# Patient Record
Sex: Male | Born: 1975 | Race: Black or African American | Hispanic: No | Marital: Married | Smoking: Current some day smoker
Health system: Southern US, Community
[De-identification: ages and names within clinical notes are randomized; demographics above are authoritative.]

## PROBLEM LIST (undated history)

## (undated) DIAGNOSIS — K219 Gastro-esophageal reflux disease without esophagitis: Secondary | ICD-10-CM

## (undated) DIAGNOSIS — T4145XA Adverse effect of unspecified anesthetic, initial encounter: Secondary | ICD-10-CM

## (undated) DIAGNOSIS — S0291XA Unspecified fracture of skull, initial encounter for closed fracture: Secondary | ICD-10-CM

## (undated) DIAGNOSIS — R51 Headache: Secondary | ICD-10-CM

## (undated) DIAGNOSIS — M1990 Unspecified osteoarthritis, unspecified site: Secondary | ICD-10-CM

## (undated) DIAGNOSIS — T8859XA Other complications of anesthesia, initial encounter: Secondary | ICD-10-CM

## (undated) DIAGNOSIS — R569 Unspecified convulsions: Secondary | ICD-10-CM

## (undated) DIAGNOSIS — R519 Headache, unspecified: Secondary | ICD-10-CM

## (undated) DIAGNOSIS — S0990XA Unspecified injury of head, initial encounter: Secondary | ICD-10-CM

## (undated) DIAGNOSIS — I2699 Other pulmonary embolism without acute cor pulmonale: Secondary | ICD-10-CM

## (undated) HISTORY — PX: EYE SURGERY: SHX253

## (undated) HISTORY — PX: CATARACT EXTRACTION: SUR2

## (undated) HISTORY — DX: Unspecified convulsions: R56.9

## (undated) HISTORY — PX: SHOULDER SURGERY: SHX246

## (undated) HISTORY — PX: NECK SURGERY: SHX720

## (undated) HISTORY — PX: KNEE ASPIRATION: SHX1892

## (undated) HISTORY — PX: CRANIECTOMY: SHX331

## (undated) HISTORY — PX: STOMACH SURGERY: SHX791

## (undated) HISTORY — PX: BREAST EXCISIONAL BIOPSY: SUR124

## (undated) HISTORY — PX: BACK SURGERY: SHX140

## (undated) HISTORY — PX: KNEE SURGERY: SHX244

---

## 1997-09-09 ENCOUNTER — Emergency Department (HOSPITAL_COMMUNITY): Admission: EM | Admit: 1997-09-09 | Discharge: 1997-09-09 | Payer: Self-pay | Admitting: Emergency Medicine

## 2001-06-17 ENCOUNTER — Encounter: Payer: Self-pay | Admitting: Emergency Medicine

## 2001-06-17 ENCOUNTER — Inpatient Hospital Stay (HOSPITAL_COMMUNITY): Admission: AC | Admit: 2001-06-17 | Discharge: 2001-06-29 | Payer: Self-pay

## 2001-06-18 ENCOUNTER — Encounter: Payer: Self-pay | Admitting: General Surgery

## 2001-06-19 ENCOUNTER — Encounter: Payer: Self-pay | Admitting: General Surgery

## 2001-06-20 ENCOUNTER — Encounter: Payer: Self-pay | Admitting: General Surgery

## 2001-06-21 ENCOUNTER — Encounter: Payer: Self-pay | Admitting: General Surgery

## 2001-06-21 ENCOUNTER — Encounter: Payer: Self-pay | Admitting: Neurosurgery

## 2001-06-22 ENCOUNTER — Encounter: Payer: Self-pay | Admitting: General Surgery

## 2001-06-23 ENCOUNTER — Encounter: Payer: Self-pay | Admitting: General Surgery

## 2001-06-24 ENCOUNTER — Encounter: Payer: Self-pay | Admitting: General Surgery

## 2001-06-24 ENCOUNTER — Encounter: Payer: Self-pay | Admitting: Neurosurgery

## 2001-06-25 ENCOUNTER — Encounter: Payer: Self-pay | Admitting: General Surgery

## 2001-06-26 ENCOUNTER — Encounter: Payer: Self-pay | Admitting: General Surgery

## 2001-06-27 ENCOUNTER — Encounter: Payer: Self-pay | Admitting: General Surgery

## 2001-06-28 ENCOUNTER — Encounter: Payer: Self-pay | Admitting: General Surgery

## 2001-06-29 ENCOUNTER — Inpatient Hospital Stay (HOSPITAL_COMMUNITY)
Admission: AD | Admit: 2001-06-29 | Discharge: 2001-07-09 | Payer: Self-pay | Admitting: Physical Medicine & Rehabilitation

## 2001-06-29 ENCOUNTER — Encounter: Payer: Self-pay | Admitting: General Surgery

## 2001-06-30 ENCOUNTER — Encounter: Payer: Self-pay | Admitting: Physical Medicine & Rehabilitation

## 2001-07-03 ENCOUNTER — Encounter: Payer: Self-pay | Admitting: Physical Medicine & Rehabilitation

## 2001-07-05 ENCOUNTER — Encounter: Payer: Self-pay | Admitting: Physical Medicine & Rehabilitation

## 2001-07-06 ENCOUNTER — Encounter: Payer: Self-pay | Admitting: Neurosurgery

## 2001-07-09 ENCOUNTER — Inpatient Hospital Stay (HOSPITAL_COMMUNITY): Admission: AD | Admit: 2001-07-09 | Discharge: 2001-07-12 | Payer: Self-pay | Admitting: Neurosurgery

## 2001-07-22 ENCOUNTER — Encounter
Admission: RE | Admit: 2001-07-22 | Discharge: 2001-10-20 | Payer: Self-pay | Admitting: Physical Medicine & Rehabilitation

## 2001-08-10 ENCOUNTER — Encounter: Payer: Self-pay | Admitting: Ophthalmology

## 2001-08-10 ENCOUNTER — Ambulatory Visit (HOSPITAL_COMMUNITY): Admission: RE | Admit: 2001-08-10 | Discharge: 2001-08-11 | Payer: Self-pay | Admitting: Ophthalmology

## 2001-08-30 ENCOUNTER — Encounter: Payer: Self-pay | Admitting: Physical Medicine & Rehabilitation

## 2001-08-30 ENCOUNTER — Ambulatory Visit (HOSPITAL_COMMUNITY)
Admission: RE | Admit: 2001-08-30 | Discharge: 2001-08-30 | Payer: Self-pay | Admitting: Physical Medicine & Rehabilitation

## 2001-10-21 ENCOUNTER — Encounter
Admission: RE | Admit: 2001-10-21 | Discharge: 2002-01-19 | Payer: Self-pay | Admitting: Physical Medicine & Rehabilitation

## 2002-09-17 ENCOUNTER — Encounter: Payer: Self-pay | Admitting: Orthopedic Surgery

## 2002-09-17 ENCOUNTER — Ambulatory Visit (HOSPITAL_COMMUNITY): Admission: RE | Admit: 2002-09-17 | Discharge: 2002-09-17 | Payer: Self-pay | Admitting: Orthopedic Surgery

## 2003-04-22 ENCOUNTER — Emergency Department (HOSPITAL_COMMUNITY): Admission: EM | Admit: 2003-04-22 | Discharge: 2003-04-22 | Payer: Self-pay | Admitting: Emergency Medicine

## 2005-03-05 ENCOUNTER — Encounter: Admission: RE | Admit: 2005-03-05 | Discharge: 2005-03-05 | Payer: Self-pay | Admitting: Emergency Medicine

## 2005-04-14 ENCOUNTER — Emergency Department (HOSPITAL_COMMUNITY): Admission: EM | Admit: 2005-04-14 | Discharge: 2005-04-14 | Payer: Self-pay | Admitting: Emergency Medicine

## 2007-06-18 ENCOUNTER — Emergency Department (HOSPITAL_COMMUNITY): Admission: EM | Admit: 2007-06-18 | Discharge: 2007-06-18 | Payer: Self-pay | Admitting: Emergency Medicine

## 2009-01-27 ENCOUNTER — Emergency Department (HOSPITAL_COMMUNITY): Admission: EM | Admit: 2009-01-27 | Discharge: 2009-01-27 | Payer: Self-pay | Admitting: Emergency Medicine

## 2010-05-28 LAB — BASIC METABOLIC PANEL
BUN: 13 mg/dL (ref 6–23)
CO2: 28 mEq/L (ref 19–32)
Calcium: 9.3 mg/dL (ref 8.4–10.5)
Chloride: 108 mEq/L (ref 96–112)
Creatinine, Ser: 1 mg/dL (ref 0.4–1.5)
GFR calc Af Amer: >60 mL/min (ref >60)

## 2010-05-28 LAB — CBC
MCHC: 34.8 g/dL (ref 30.0–36.0)
MCV: 93.1 fL (ref 78.0–100.0)
Platelets: 186 10*3/uL (ref 150–400)
RBC: 4.66 MIL/uL (ref 4.22–5.81)

## 2010-05-28 LAB — D-DIMER, QUANTITATIVE: D-Dimer, Quant: 0.28 ug/mL-FEU (ref 0.00–0.48)

## 2010-05-28 LAB — PROTIME-INR: Prothrombin Time: 13.1 seconds (ref 11.6–15.2)

## 2010-05-28 LAB — POCT CARDIAC MARKERS: Myoglobin, poc: 125 ng/mL (ref 12–200)
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
## 2011-06-27 ENCOUNTER — Emergency Department (HOSPITAL_COMMUNITY)
Admission: EM | Admit: 2011-06-27 | Discharge: 2011-06-27 | Disposition: A | Discharge status: Home / Self Care | Payer: Federal, State, Local not specified - PPO | Attending: Emergency Medicine | Admitting: Emergency Medicine

## 2011-06-27 ENCOUNTER — Encounter (HOSPITAL_COMMUNITY): Payer: Self-pay | Admitting: *Deleted

## 2011-06-27 ENCOUNTER — Emergency Department (HOSPITAL_COMMUNITY): Payer: Federal, State, Local not specified - PPO

## 2011-06-27 DIAGNOSIS — R51 Headache: Secondary | ICD-10-CM | POA: Insufficient documentation

## 2011-06-27 DIAGNOSIS — R29898 Other symptoms and signs involving the musculoskeletal system: Secondary | ICD-10-CM | POA: Insufficient documentation

## 2011-06-27 DIAGNOSIS — M25519 Pain in unspecified shoulder: Secondary | ICD-10-CM | POA: Insufficient documentation

## 2011-06-27 DIAGNOSIS — F17200 Nicotine dependence, unspecified, uncomplicated: Secondary | ICD-10-CM | POA: Insufficient documentation

## 2011-06-27 DIAGNOSIS — R209 Unspecified disturbances of skin sensation: Secondary | ICD-10-CM | POA: Insufficient documentation

## 2011-06-27 DIAGNOSIS — Z87820 Personal history of traumatic brain injury: Secondary | ICD-10-CM | POA: Insufficient documentation

## 2011-06-27 DIAGNOSIS — Z79899 Other long term (current) drug therapy: Secondary | ICD-10-CM | POA: Insufficient documentation

## 2011-06-27 MED ORDER — METOCLOPRAMIDE HCL 10 MG PO TABS: 10.0000 mg | ORAL_TABLET | Freq: Four times a day (QID) | ORAL | Status: DC | PRN

## 2011-06-27 MED ORDER — HYDROCODONE-ACETAMINOPHEN 5-325 MG PO TABS: 2.0000 | ORAL_TABLET | ORAL | Status: AC | PRN

## 2011-06-27 NOTE — Discharge Instructions (Signed)

## 2011-06-27 NOTE — ED Notes (Signed)
Pt returned from CT scan.

## 2011-06-27 NOTE — ED Notes (Signed)
Pt reports blurry vision
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
## 2011-06-27 NOTE — ED Provider Notes (Signed)
History     CSN: [id number]  Arrival date & time 06/27/11  0144   First MD Initiated Contact with Patient 06/27/11 0208      Chief Complaint  Patient presents with  . 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 He now presents with a gradual onset 2-3 day history of a pressure-like headache without any sudden onset and without any significant change in speech vision swallowing or understanding. He also has no vertigo. There is no recent trauma. There is no fever. He has no neck pain or back pain. He has had one to 2 weeks of left shoulder pain with associated weakness and numbness on the ulnar nerve distribution region of his left arm which is improving with steroids from his usual family doctor.  Since he does not usually get headaches and has a history of a traumatic brain injury he wants to get a CT scan of the head tonight if possible. He has not had any new weakness or numbness to his left arm since his before headache, he also has no weakness or numbness to his left leg. There's been no treatment prior to arrival of his headache he did drive himself to the emergency department and does not want medicines while he is in the ED for his headache. History reviewed. No pertinent past medical history. Prior traumatic brain injury with some short-term memory problems and poor vision as well as apparent posttraumatic seizures in the past History reviewed. No pertinent past surgical history.  No family history on file.  History  Substance Use Topics  . Smoking status: Current Everyday Smoker  . Smokeless tobacco: Not on file  . Alcohol Use: No      Review of Systems  Constitutional: Negative for fever.       10 Systems reviewed and are  negative for acute change except as noted in the HPI.  HENT: Negative for congestion and neck pain.   Eyes: Negative for pain, discharge and redness.  Respiratory: Negative for cough and shortness of breath.   Cardiovascular: Negative for chest pain.  Gastrointestinal: Negative for vomiting and abdominal pain.  Musculoskeletal: Negative for back pain.  Skin: Negative for rash.  Neurological: Positive for headaches. Negative for seizures, syncope, speech difficulty, weakness, light-headedness and numbness.  Psychiatric/Behavioral:       No behavior change.    Allergies  Review of patient's allergies indicates no known allergies.  Home Medications   Current Outpatient Rx  Name Route Sig Dispense Refill  . HYDROCODONE-ACETAMINOPHEN 5-325 MG PO TABS Oral Take 2 tablets by mouth every 4 (four) hours as needed for pain. 10 tablet 0  . METOCLOPRAMIDE HCL 10 MG PO TABS Oral Take 1 tablet (10 mg total) by mouth every 6 (six) hours as needed (nausea/headache). 6 tablet 0    BP 103/67  Pulse 80  Temp(Src) 97.4 F (36.3 C) (Oral)  Resp 18  SpO2 96%  Physical Exam  Nursing note and vitals reviewed. Constitutional:       Awake, alert, nontoxic appearance with baseline speech for patient.  HENT:  Head: Atraumatic.  Mouth/Throat: No oropharyngeal exudate.  Eyes: EOM are normal. Pupils are equal, round, and reactive to light. Right eye exhibits no discharge. Left eye exhibits no discharge.  Neck: Neck supple.  Cardiovascular: Normal rate and regular  rhythm.   No murmur heard. Pulmonary/Chest: Effort normal and breath sounds normal. No stridor. No respiratory distress. He has no wheezes. He has no rales. He exhibits no tenderness.  Abdominal: Soft. Bowel sounds are normal. He exhibits no mass. There is no tenderness. There is no rebound.  Musculoskeletal: He exhibits no tenderness.       Baseline ROM, moves extremities with no obvious new focal weakness.  Lymphadenopathy:    He has no  cervical adenopathy.  Neurological: He is alert.       Awake, alert, cooperative and aware of situation; motor strength bilaterally 5/5; sensation normal to light touch bilaterally except slight decreased light touch to his left small finger and ring finger ; peripheral visual fields full to confrontation; no facial asymmetry; tongue midline; major cranial nerves appear intact; no pronator drift, normal finger to nose bilaterally  Skin: No rash noted.  Psychiatric: He has a normal mood and affect.    ED Course  Procedures (including critical care time)  Labs Reviewed - No data to display Ct Head Wo Contrast  06/27/2011  *RADIOLOGY REPORT*  Clinical Data: Seizure, headache.  CT HEAD WITHOUT CONTRAST  Technique:  Contiguous axial images were obtained from the base of the skull through the vertex without contrast.  Comparison: 06/18/2007  Findings: Left temporal lobe encephalomalacia inferiorly.  Inferior left frontal lobe encephalomalacia.  Evidence of prior left craniotomy. There is no evidence for acute hemorrhage, hydrocephalus, mass lesion, or abnormal extra-axial fluid collection.  No definite CT evidence for acute infarction.  The visualized paranasal sinuses and mastoid air cells are predominately clear.  Right maxillary sinus and left maxillary sinus each contain a mucous retention cyst.  IMPRESSION: Left frontal and temporal lobe encephalomalacia.  No definite acute intracranial abnormality.  Original Report Authenticated By: Waneta Martins, M.D.     1. Headache       MDM  Pt stable in ED with no significant deterioration in condition.Patient / Family / Caregiver informed of clinical course, understand medical decision-making process, and agree with plan.I doubt any other EMC precluding discharge at this time including, but not necessarily limited to the following:SAH, CVA, SBI.        Hurman Horn, MD 06/28/11 0630

## 2011-06-27 NOTE — ED Notes (Signed)
The pt has had head pain and pressure for 2 days

## 2011-06-27 NOTE — ED Notes (Signed)
Rx x 2. Pt voiced understanding to f/u with PCP in 3-5 days.

## 2011-07-02 ENCOUNTER — Ambulatory Visit: Payer: Self-pay

## 2012-02-09 ENCOUNTER — Emergency Department (INDEPENDENT_AMBULATORY_CARE_PROVIDER_SITE_OTHER): Payer: Medicare Other

## 2012-02-09 ENCOUNTER — Other Ambulatory Visit: Payer: Self-pay

## 2012-02-09 ENCOUNTER — Emergency Department (INDEPENDENT_AMBULATORY_CARE_PROVIDER_SITE_OTHER)
Admission: EM | Admit: 2012-02-09 | Discharge: 2012-02-09 | Disposition: A | Discharge status: Home / Self Care | Payer: Medicare Other | Source: Home / Self Care

## 2012-02-09 ENCOUNTER — Encounter (HOSPITAL_COMMUNITY): Payer: Self-pay | Admitting: Emergency Medicine

## 2012-02-09 DIAGNOSIS — R064 Hyperventilation: Secondary | ICD-10-CM

## 2012-02-09 DIAGNOSIS — R0789 Other chest pain: Secondary | ICD-10-CM

## 2012-02-09 DIAGNOSIS — R071 Chest pain on breathing: Secondary | ICD-10-CM

## 2012-02-09 LAB — D-DIMER, QUANTITATIVE: D-Dimer, Quant: <0.27 ug/mL-FEU (ref 0.00–0.48)

## 2012-02-09 MED ORDER — KETOROLAC TROMETHAMINE 60 MG/2ML IM SOLN
INTRAMUSCULAR | Status: AC
  Filled 2012-02-09: qty 2

## 2012-02-09 MED ORDER — NAPROXEN 500 MG PO TBEC: 500.0000 mg | DELAYED_RELEASE_TABLET | Freq: Two times a day (BID) | ORAL | Status: DC

## 2012-02-09 MED ORDER — LORAZEPAM 2 MG/ML IJ SOLN
1.0000 mg | Freq: Once | INTRAMUSCULAR | Status: AC
  Administered 2012-02-09: 1 mg via INTRAMUSCULAR

## 2012-02-09 MED ORDER — KETOROLAC TROMETHAMINE 60 MG/2ML IM SOLN
60.0000 mg | Freq: Once | INTRAMUSCULAR | Status: AC
  Administered 2012-02-09: 60 mg via INTRAMUSCULAR

## 2012-02-09 MED ORDER — ACETAMINOPHEN 325 MG PO TABS
ORAL_TABLET | ORAL | Status: AC
  Filled 2012-02-09: qty 2

## 2012-02-09 MED ORDER — TRAMADOL HCL 50 MG PO TABS: 50.0000 mg | ORAL_TABLET | Freq: Four times a day (QID) | ORAL | Status: DC | PRN

## 2012-02-09 MED ORDER — ACETAMINOPHEN 325 MG PO TABS
650.0000 mg | ORAL_TABLET | Freq: Once | ORAL | Status: AC
  Administered 2012-02-09: 650 mg via ORAL

## 2012-02-09 MED ORDER — LORAZEPAM 2 MG/ML IJ SOLN
INTRAMUSCULAR | Status: AC
  Filled 2012-02-09: qty 1

## 2012-02-09 NOTE — ED Provider Notes (Addendum)
History     CSN: [id number]  Arrival date & time 02/09/12  1746   None     Chief Complaint  Patient presents with  . Chest Pain    (Consider location/radiation/quality/duration/timing/severity/associated sxs/prior treatment) HPI Comments: 36 year old male states that he is having left anterior chest pain. He describes it as a drawing and sharp pain. Has pain when taking a deep breath.It is pleuritic.  He is breathing approximately 40 times a minute with short choppy breaths. Is also complaining of  paresthesias especially in his left arm. States his left arm is weak he can barely raise it above his head. He felt well last night until after eating.  He then started feeling cold all night long, states his temperature was 100.2 degrees and later 101.3. This was associated with shaking chills. [facility] earlier today and his flu test  was negative. Because he had a systolic blood pressure of 100 at the [facility] he was sent to the urgent care. Is expending a great amount of energy to breath and wincing of discomfort in his face and grabbing his left chest.  Patient is a 36 y.o. male presenting with chest pain.  Chest Pain Primary symptoms include a fever.     History reviewed. No pertinent past medical history.  Past Surgical History  Procedure Date  . Eye surgery   . Neck surgery   . Stomach surgery     No family history on file.  History  Substance Use Topics  . Smoking status: Current Every Day Smoker  . Smokeless tobacco: Not on file  . Alcohol Use: No      Review of Systems  Constitutional: Positive for fever and chills.  HENT: Negative for congestion, sore throat, facial swelling, rhinorrhea, neck stiffness, voice change and postnasal drip.   Cardiovascular: Positive for chest pain.  Gastrointestinal: Negative.   Musculoskeletal: Negative for back pain.    Allergies  Review of patient's allergies indicates no known allergies.  Home  Medications   Current Outpatient Rx  Name  Route  Sig  Dispense  Refill  . METOCLOPRAMIDE HCL 10 MG PO TABS   Oral   Take 1 tablet (10 mg total) by mouth every 6 (six) hours as needed (nausea/headache).   6 tablet   0   . NAPROXEN 500 MG PO TBEC   Oral   Take 1 tablet (500 mg total) by mouth 2 (two) times daily with a meal.   20 tablet   0   . TRAMADOL HCL 50 MG PO TABS   Oral   Take 1 tablet (50 mg total) by mouth every 6 (six) hours as needed for pain. Take one tablet every 4 hours as needed for pain   25 tablet   0     BP 113/69  Pulse 114  Temp 98.2 F (36.8 C) (Oral)  Resp 22  SpO2 100%  Physical Exam  Constitutional: He is oriented to person, place, and time. He appears well-developed and well-nourished. He appears distressed.  Neck: Normal range of motion. Neck supple.  Cardiovascular: Normal rate, regular rhythm and normal heart sounds.   Pulmonary/Chest: He has no wheezes. He has no rales.       Patient is hyperventilating clearly putting a lot of energy  into his respirations. He is taking rapid shallow breaths. From what I can auscultate his lungs are clear.    Musculoskeletal: He exhibits tenderness.       He has some  left anterior chest wall tenderness however most of the pain comes from taking a deep breath. Otherwise no muscular skeletal pain or tenderness.  Neurological: He is alert and oriented to person, place, and time.  Skin: Skin is warm and dry. No erythema.    ED Course  Procedures (including critical care time)   Labs Reviewed  D-DIMER, QUANTITATIVE   Dg Chest 2 View  02/09/2012  *RADIOLOGY REPORT*  Clinical Data: Chest pain, tachypnea  CHEST - 2 VIEW  Comparison: 01/27/2009  Findings: Lungs are clear. No pleural effusion or pneumothorax.  Cardiomediastinal silhouette is within normal limits.  Visualized osseous structures are within normal limits.  IMPRESSION: No evidence of acute cardiopulmonary disease.   Original Report Authenticated  By: Charline Bills, M.D.      1. Chest wall pain   2. Hyperventilation       MDM   Results for orders placed during the hospital encounter of 02/09/12  D-DIMER, QUANTITATIVE      Component Value Range   D-Dimer, Quant <0.27  0.00 - 0.48 ug/mL-FEU   No information on file. Dg Chest 2 View  02/09/2012  *RADIOLOGY REPORT*  Clinical Data: Chest pain, tachypnea  CHEST - 2 VIEW  Comparison: 01/27/2009  Findings: Lungs are clear. No pleural effusion or pneumothorax.  Cardiomediastinal silhouette is within normal limits.  Visualized osseous structures are within normal limits.  IMPRESSION: No evidence of acute cardiopulmonary disease.   Original Report Authenticated By: Charline Bills, M.D.   EKG: Normal sinus rhythm no ectopy. No ischemic changes, reviewed by Dr. Delene Ruffini The patient was placed on a rebreathing bag to assist with hyperventilation. A d-dimer and chest x-ray was ordered. Results are above. Ativan 1 mg IM administered for anxiety and hyperventilation. Once the d-dimer was back he was administered Toradol 60 mg IM. After this treatment he was lying down and relaxed posturing position with no hyperventilation. He states he was feeling better and ready to go. He does have a low-grade fever I suspect he may also have a viral type illness as well. He is instructed to take Tylenol every 4 hours as needed for fever and if he develops shortness of breath, cough, fever or other symptoms or worsening he may return or go to the emergency department or follow with his physician.         Hayden Rasmussen, NP 02/09/12 2003  Hayden Rasmussen, NP 02/13/12 2114

## 2012-02-09 NOTE — ED Notes (Addendum)
Pt c/o chest pain since yesterday night... Sx include: fever this am 102, shivering, blurry vision, headaches, numbness of left arm, diarrhea.... Denies: vomiting, nauseas... No history of HTN... Family hx of CVD... Pt is alert w/pain discomfort... Took Theraflu last night... [facility] and was told [facility] BP was low (100/58)

## 2012-02-09 NOTE — ED Notes (Signed)
Pt texting on phone while being triaged.

## 2012-02-13 NOTE — ED Provider Notes (Signed)
Medical screening examination/treatment/procedure(s) were performed by resident physician or non-physician practitioner and as supervising physician I was immediately available for consultation/collaboration.   KINDL,JAMES DOUGLAS MD.    James D Kindl, MD 02/13/12 1123 

## 2012-02-14 NOTE — ED Provider Notes (Signed)
Medical screening examination/treatment/procedure(s) were performed by resident physician or non-physician practitioner and as supervising physician I was immediately available for consultation/collaboration.   KINDL,JAMES DOUGLAS MD.    James D Kindl, MD 02/14/12 1303 

## 2012-12-08 ENCOUNTER — Telehealth: Payer: Self-pay | Admitting: Neurology

## 2012-12-08 NOTE — Telephone Encounter (Signed)
Chart reviewed, he has traumatic brain injury, seizure disorder.  He was taking Depakote, but on longer on it, last visit was in May 2013. I called him, he complains of dizziness.  Please call him, give him a follow up with NP in next available.

## 2013-01-12 ENCOUNTER — Encounter (HOSPITAL_COMMUNITY): Payer: Self-pay | Admitting: Emergency Medicine

## 2013-01-12 ENCOUNTER — Emergency Department (HOSPITAL_COMMUNITY)
Admission: EM | Admit: 2013-01-12 | Discharge: 2013-01-12 | Disposition: A | Discharge status: Home / Self Care | Payer: Federal, State, Local not specified - PPO | Attending: Emergency Medicine | Admitting: Emergency Medicine

## 2013-01-12 DIAGNOSIS — Z79899 Other long term (current) drug therapy: Secondary | ICD-10-CM | POA: Insufficient documentation

## 2013-01-12 DIAGNOSIS — R569 Unspecified convulsions: Secondary | ICD-10-CM

## 2013-01-12 DIAGNOSIS — Z8781 Personal history of (healed) traumatic fracture: Secondary | ICD-10-CM | POA: Insufficient documentation

## 2013-01-12 DIAGNOSIS — G40909 Epilepsy, unspecified, not intractable, without status epilepticus: Secondary | ICD-10-CM | POA: Insufficient documentation

## 2013-01-12 DIAGNOSIS — F17200 Nicotine dependence, unspecified, uncomplicated: Secondary | ICD-10-CM | POA: Insufficient documentation

## 2013-01-12 DIAGNOSIS — Z87820 Personal history of traumatic brain injury: Secondary | ICD-10-CM | POA: Insufficient documentation

## 2013-01-12 HISTORY — DX: Unspecified injury of head, initial encounter: S09.90XA

## 2013-01-12 HISTORY — DX: Unspecified fracture of skull, initial encounter for closed fracture: S02.91XA

## 2013-01-12 LAB — CBC WITH DIFFERENTIAL/PLATELET
Eosinophils Absolute: 0.3 10*3/uL (ref 0.0–0.7)
Eosinophils Relative: 6 % — ABNORMAL HIGH (ref 0–5)
HCT: 43.2 % (ref 39.0–52.0)
Hemoglobin: 14.6 g/dL (ref 13.0–17.0)
Lymphocytes Relative: 47 % — ABNORMAL HIGH (ref 12–46)
Lymphs Abs: 2.6 10*3/uL (ref 0.7–4.0)
MCH: 30.5 pg (ref 26.0–34.0)
MCV: 90.2 fL (ref 78.0–100.0)
Monocytes Absolute: 0.4 10*3/uL (ref 0.1–1.0)
Monocytes Relative: 7 % (ref 3–12)
Platelets: 243 10*3/uL (ref 150–400)
RBC: 4.79 MIL/uL (ref 4.22–5.81)
WBC: 5.4 10*3/uL (ref 4.0–10.5)

## 2013-01-12 LAB — BASIC METABOLIC PANEL
CO2: 25 mEq/L (ref 19–32)
Calcium: 9.6 mg/dL (ref 8.4–10.5)
Chloride: 106 mEq/L (ref 96–112)
Glucose, Bld: 94 mg/dL (ref 70–99)
Potassium: 3.6 mEq/L (ref 3.5–5.1)
Sodium: 141 mEq/L (ref 135–145)

## 2013-01-12 LAB — VALPROIC ACID LEVEL: Valproic Acid Lvl: <10 ug/mL — ABNORMAL LOW (ref 50.0–100.0)

## 2013-01-12 MED ORDER — DIVALPROEX SODIUM 250 MG PO DR TAB
500.0000 mg | DELAYED_RELEASE_TABLET | Freq: Two times a day (BID) | ORAL | Status: DC
  Administered 2013-01-12: 500 mg via ORAL
  Filled 2013-01-12: qty 2

## 2013-01-12 NOTE — ED Notes (Signed)
Patient with no complaints at this time. Respirations even and unlabored. Skin warm/dry. Discharge instructions reviewed with patient at this time. Patient given opportunity to voice concerns/ask questions. IV removed per policy and band-aid applied to site. Patient discharged at this time and left Emergency Department with steady gait.  

## 2013-01-12 NOTE — ED Notes (Signed)
Patient arrives via EMS from work with c/o seizure. H/o seizures post head injury requiring craniotomy 11 years ago. Reports last seizure was 5 years ago. Denies missing medication. Only c/o is of cramping to hands and feet.

## 2013-01-12 NOTE — ED Provider Notes (Signed)
CSN: [id number]     Arrival date & time 01/12/13  1101 History   First MD Initiated Contact with Patient 01/12/13 1238     Chief Complaint  Patient presents with  . Seizures   (Consider location/radiation/quality/duration/timing/severity/associated sxs/prior Treatment) HPI.... level V caveat for urgent need for intervention.   Patient apparently had a tonic-clonic seizure at work this morning.  He is on Depakote secondary to a seizure disorder which resulted after traumatic brain injury approximately 11 years ago.  He has not had a seizure in 5 years.  He is feeling better now.   No fever, chills, stiff neck  Past Medical History  Diagnosis Date  . Closed head injury     2003  . Skull fracture     2003   Past Surgical History  Procedure Laterality Date  . Eye surgery    . Neck surgery    . Stomach surgery    . Craniectomy      2003   No family history on file. History  Substance Use Topics  . Smoking status: Current Some Day Smoker    Types: Cigarettes  . Smokeless tobacco: Not on file  . Alcohol Use: No    Review of Systems  Unable to perform ROS: Acuity of condition    Allergies  Review of patient's allergies indicates no known allergies.  Home Medications   Current Outpatient Rx  Name  Route  Sig  Dispense  Refill  . divalproex (DEPAKOTE ER) 250 MG 24 hr tablet   Oral   Take 500 mg by mouth 2 (two) times daily.          BP 119/79  Pulse 80  Temp(Src) 98.7 F (37.1 C) (Oral)  Resp 15  Ht 5\' 11"  (1.803 m)  Wt 265 lb (120.203 kg)  BMI 36.98 kg/m2  SpO2 100% Physical Exam  Nursing note and vitals reviewed. Constitutional: He is oriented to person, place, and time. He appears well-developed and well-nourished.  HENT:  Head: Normocephalic and atraumatic.  Eyes: Conjunctivae and EOM are normal. Pupils are equal, round, and reactive to light.  Neck: Normal range of motion. Neck supple.  Cardiovascular: Normal rate, regular rhythm and normal heart  sounds.   Pulmonary/Chest: Effort normal and breath sounds normal.  Abdominal: Soft. Bowel sounds are normal.  nontender  Genitourinary:  Normal genitalia  Musculoskeletal: Normal range of motion.  Neurological: He is alert and oriented to person, place, and time.  Skin: Skin is warm and dry.  Pink color  Psychiatric: He has a normal mood and affect. His behavior is normal.    ED Course  Procedures (including critical care time) Labs Review Labs Reviewed  CBC WITH DIFFERENTIAL - Abnormal; Notable for the following:    Neutrophils Relative % 39 (*)    Lymphocytes Relative 47 (*)    Eosinophils Relative 6 (*)    All other components within normal limits  VALPROIC ACID LEVEL - Abnormal; Notable for the following:    Valproic Acid Lvl <10.0 (*)    All other components within normal limits  BASIC METABOLIC PANEL   Imaging Review No results found.  EKG Interpretation   None       MDM   1. Seizure     Valproic acid level noted to be subtherapeutic.   Will increase medication 50%.   Patient will followup with his neurologist. Discussed with patient and his mother. Patient is neurologically intact at discharge    Donnetta Hutching, MD 01/12/13  1608 

## 2013-03-18 ENCOUNTER — Encounter: Payer: Self-pay | Admitting: Nurse Practitioner

## 2013-03-21 ENCOUNTER — Encounter: Payer: Self-pay | Admitting: Neurology

## 2013-03-21 ENCOUNTER — Encounter: Payer: Self-pay | Admitting: Nurse Practitioner

## 2013-03-21 ENCOUNTER — Other Ambulatory Visit: Payer: Self-pay | Admitting: Nurse Practitioner

## 2013-03-21 ENCOUNTER — Telehealth: Payer: Self-pay | Admitting: Nurse Practitioner

## 2013-03-21 ENCOUNTER — Encounter (INDEPENDENT_AMBULATORY_CARE_PROVIDER_SITE_OTHER): Payer: Self-pay

## 2013-03-21 ENCOUNTER — Ambulatory Visit (INDEPENDENT_AMBULATORY_CARE_PROVIDER_SITE_OTHER): Payer: Federal, State, Local not specified - PPO | Admitting: Nurse Practitioner

## 2013-03-21 VITALS — BP 114/75 | HR 96 | Ht 71.0 in | Wt 270.0 lb

## 2013-03-21 DIAGNOSIS — G40209 Localization-related (focal) (partial) symptomatic epilepsy and epileptic syndromes with complex partial seizures, not intractable, without status epilepticus: Secondary | ICD-10-CM

## 2013-03-21 DIAGNOSIS — Z79899 Other long term (current) drug therapy: Secondary | ICD-10-CM

## 2013-03-21 DIAGNOSIS — R569 Unspecified convulsions: Secondary | ICD-10-CM

## 2013-03-21 DIAGNOSIS — G40909 Epilepsy, unspecified, not intractable, without status epilepticus: Secondary | ICD-10-CM

## 2013-03-21 DIAGNOSIS — S0990XA Unspecified injury of head, initial encounter: Secondary | ICD-10-CM

## 2013-03-21 LAB — VALPROIC ACID LEVEL

## 2013-03-21 MED ORDER — DIVALPROEX SODIUM ER 500 MG PO TB24: 500.0000 mg | ORAL_TABLET | Freq: Two times a day (BID) | ORAL | Status: DC
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
## 2013-03-21 NOTE — Telephone Encounter (Signed)
Attempted to call listed number, unable to leave a message.

## 2013-03-21 NOTE — Patient Instructions (Signed)
Continue Depakote at current dose Will check labs today Followup 6 months Call for any seizure activity

## 2013-03-22 NOTE — Telephone Encounter (Signed)
TC to patient. He says he has been taking the medication but when I ask him to read be what is on the bottle he claims it expired in April of last year. Pt asked to pick RX from appt yesterday, take the medication as directed and repeat level in 10 days. He is not to drive as he is at risk for seizures.

## 2013-05-06 ENCOUNTER — Other Ambulatory Visit (INDEPENDENT_AMBULATORY_CARE_PROVIDER_SITE_OTHER): Payer: Self-pay

## 2013-05-06 DIAGNOSIS — Z0289 Encounter for other administrative examinations: Secondary | ICD-10-CM

## 2013-05-06 DIAGNOSIS — Z79899 Other long term (current) drug therapy: Secondary | ICD-10-CM

## 2013-05-06 DIAGNOSIS — G40909 Epilepsy, unspecified, not intractable, without status epilepticus: Secondary | ICD-10-CM

## 2013-05-07 LAB — VALPROIC ACID LEVEL: VALPROIC ACID LVL: 51 ug/mL (ref 50–100)

## 2013-09-19 ENCOUNTER — Telehealth: Payer: Self-pay | Admitting: Nurse Practitioner

## 2013-09-19 ENCOUNTER — Ambulatory Visit: Payer: Federal, State, Local not specified - PPO | Admitting: Nurse Practitioner

## 2013-09-19 NOTE — Telephone Encounter (Signed)
No show for scheduled appt 

## 2014-03-28 ENCOUNTER — Other Ambulatory Visit: Payer: Self-pay | Admitting: Orthopedic Surgery

## 2014-03-28 DIAGNOSIS — M48061 Spinal stenosis, lumbar region without neurogenic claudication: Secondary | ICD-10-CM

## 2014-04-10 ENCOUNTER — Other Ambulatory Visit: Payer: Federal, State, Local not specified - PPO

## 2015-10-10 DIAGNOSIS — K080 Exfoliation of teeth due to systemic causes: Secondary | ICD-10-CM | POA: Diagnosis not present

## 2015-12-16 DIAGNOSIS — R222 Localized swelling, mass and lump, trunk: Secondary | ICD-10-CM | POA: Diagnosis not present

## 2015-12-16 DIAGNOSIS — R0789 Other chest pain: Secondary | ICD-10-CM | POA: Diagnosis not present

## 2015-12-17 DIAGNOSIS — R0789 Other chest pain: Secondary | ICD-10-CM | POA: Diagnosis not present

## 2015-12-17 DIAGNOSIS — R222 Localized swelling, mass and lump, trunk: Secondary | ICD-10-CM | POA: Diagnosis not present

## 2015-12-19 DIAGNOSIS — F17290 Nicotine dependence, other tobacco product, uncomplicated: Secondary | ICD-10-CM | POA: Diagnosis not present

## 2015-12-19 DIAGNOSIS — E870 Hyperosmolality and hypernatremia: Secondary | ICD-10-CM | POA: Diagnosis not present

## 2015-12-19 DIAGNOSIS — R1012 Left upper quadrant pain: Secondary | ICD-10-CM | POA: Diagnosis not present

## 2015-12-19 DIAGNOSIS — R0789 Other chest pain: Secondary | ICD-10-CM | POA: Diagnosis not present

## 2015-12-19 DIAGNOSIS — R569 Unspecified convulsions: Secondary | ICD-10-CM | POA: Diagnosis not present

## 2015-12-19 DIAGNOSIS — R1013 Epigastric pain: Secondary | ICD-10-CM | POA: Diagnosis not present

## 2015-12-19 DIAGNOSIS — G40909 Epilepsy, unspecified, not intractable, without status epilepticus: Secondary | ICD-10-CM | POA: Diagnosis not present

## 2015-12-19 DIAGNOSIS — Z720 Tobacco use: Secondary | ICD-10-CM | POA: Diagnosis not present

## 2015-12-19 DIAGNOSIS — K219 Gastro-esophageal reflux disease without esophagitis: Secondary | ICD-10-CM | POA: Diagnosis not present

## 2015-12-19 DIAGNOSIS — Z79899 Other long term (current) drug therapy: Secondary | ICD-10-CM | POA: Diagnosis not present

## 2015-12-19 DIAGNOSIS — I2699 Other pulmonary embolism without acute cor pulmonale: Secondary | ICD-10-CM | POA: Diagnosis not present

## 2015-12-19 DIAGNOSIS — Z888 Allergy status to other drugs, medicaments and biological substances status: Secondary | ICD-10-CM | POA: Diagnosis not present

## 2015-12-19 DIAGNOSIS — I82492 Acute embolism and thrombosis of other specified deep vein of left lower extremity: Secondary | ICD-10-CM | POA: Diagnosis not present

## 2015-12-20 DIAGNOSIS — Z09 Encounter for follow-up examination after completed treatment for conditions other than malignant neoplasm: Secondary | ICD-10-CM | POA: Diagnosis not present

## 2015-12-20 DIAGNOSIS — I2699 Other pulmonary embolism without acute cor pulmonale: Secondary | ICD-10-CM | POA: Diagnosis not present

## 2015-12-20 DIAGNOSIS — E785 Hyperlipidemia, unspecified: Secondary | ICD-10-CM | POA: Diagnosis not present

## 2015-12-20 DIAGNOSIS — I82402 Acute embolism and thrombosis of unspecified deep veins of left lower extremity: Secondary | ICD-10-CM | POA: Diagnosis not present

## 2016-01-22 DIAGNOSIS — I82402 Acute embolism and thrombosis of unspecified deep veins of left lower extremity: Secondary | ICD-10-CM | POA: Diagnosis not present

## 2016-01-22 DIAGNOSIS — I2699 Other pulmonary embolism without acute cor pulmonale: Secondary | ICD-10-CM | POA: Diagnosis not present

## 2016-04-08 DIAGNOSIS — I2699 Other pulmonary embolism without acute cor pulmonale: Secondary | ICD-10-CM | POA: Diagnosis not present

## 2016-04-08 DIAGNOSIS — I82402 Acute embolism and thrombosis of unspecified deep veins of left lower extremity: Secondary | ICD-10-CM | POA: Diagnosis not present

## 2016-04-15 DIAGNOSIS — K080 Exfoliation of teeth due to systemic causes: Secondary | ICD-10-CM | POA: Diagnosis not present

## 2016-04-17 ENCOUNTER — Encounter: Payer: Self-pay | Admitting: Hematology and Oncology

## 2016-04-17 ENCOUNTER — Telehealth: Payer: Self-pay | Admitting: Hematology and Oncology

## 2016-04-17 NOTE — Telephone Encounter (Signed)
Appt has been scheduled for the pt to see Dr. Lindi Adie on 3/5 at 345pm. Pt agreed to appt date and time. Aware to arrive 30 minutes early. Demographics verified. Letter mailed to the pt and faxed to the referring.

## 2016-04-28 ENCOUNTER — Ambulatory Visit (HOSPITAL_BASED_OUTPATIENT_CLINIC_OR_DEPARTMENT_OTHER): Payer: Federal, State, Local not specified - PPO | Admitting: Hematology and Oncology

## 2016-04-28 ENCOUNTER — Encounter: Payer: Self-pay | Admitting: Hematology and Oncology

## 2016-04-28 DIAGNOSIS — I82402 Acute embolism and thrombosis of unspecified deep veins of left lower extremity: Secondary | ICD-10-CM | POA: Diagnosis not present

## 2016-04-28 DIAGNOSIS — Z7901 Long term (current) use of anticoagulants: Secondary | ICD-10-CM

## 2016-04-28 DIAGNOSIS — R0602 Shortness of breath: Secondary | ICD-10-CM | POA: Insufficient documentation

## 2016-04-28 DIAGNOSIS — I2699 Other pulmonary embolism without acute cor pulmonale: Secondary | ICD-10-CM | POA: Diagnosis not present

## 2016-04-28 DIAGNOSIS — R635 Abnormal weight gain: Secondary | ICD-10-CM

## 2016-04-28 DIAGNOSIS — Z720 Tobacco use: Secondary | ICD-10-CM

## 2016-04-28 MED ORDER — ATORVASTATIN CALCIUM 20 MG PO TABS: 20.0000 mg | ORAL_TABLET | Freq: Every day | ORAL | Status: DC

## 2016-04-28 MED ORDER — RIVAROXABAN 20 MG PO TABS: 20.0000 mg | ORAL_TABLET | Freq: Every day | ORAL | Status: DC

## 2016-04-28 NOTE — Assessment & Plan Note (Signed)
Pulmonary embolism with left lower extremity DVT diagnosed in October 2017 and was admitted at [facility] one helped and treated with blood thinners, currently on Xarelto  Extensive workup done at [facility] revealed normal results. These included 1. Factor V Leiden 2. prothrombin gene mutation 3. Protein C, protein S, antithrombin III 4. Homocysteine levels 5. Lupus anticoagulant testing  I discussed with the patient that the above inherited risk factors are negative. Hemoglobin was sent a quite limited by quiet risk factors also negative.  Patient's risk factor: 1. Tobacco abuse: With cigars 2. sedentariness 3. Overweight  I discussed with him to stop tobacco abuse and to lose weight and stay active.  I referred him to pulmonology for evaluation of lung function studies because of patient still has shortness of breath. Since the patient has no clear identified risk factor and he is not keen on extended anticoagulation therapy, I recommended that the patient can stop blood thinners at the end of April 2018.  Return to clinic as needed.

## 2016-04-28 NOTE — Progress Notes (Signed)
[facility] NOTE  Patient Care Team: London Pepper, MD as PCP - General (Family Medicine)  CHIEF COMPLAINTS/PURPOSE OF CONSULTATION:  Left leg DVT and PE October 2070  HISTORY OF PRESENTING ILLNESS:  Antonio Smith 41 y.o. male is here because of recent diagnosis of  left leg DVT and bilateral PEs in October 2017. Patient woke up early in the morning with intense left leg pain which subsided after day 2 but then he started getting short of breath. Because this event to the emergency room [facility] He was diagnosed with bilateral acute pulmonary emboli without any heart strain.  He has been on Xarelto and tolerating it extremely well. He denies any for the leg pain but he complains of shortness of breath to minimal exertion.  I reviewed her records extensively and collaborated the history with the patient.  MEDICAL HISTORY:  Past Medical History:  Diagnosis Date  . Closed head injury    2003  . Seizures   . Skull fracture    2003    SURGICAL HISTORY: Past Surgical History:  Procedure Laterality Date  . CRANIECTOMY     2003  . EYE SURGERY    . NECK SURGERY    . STOMACH SURGERY      SOCIAL HISTORY: Social History   Social History  . Marital status: Single    Spouse name: N/A  . Number of children: 0  . Years of education: Masters   Occupational History  .  Social Security    Social History Main Topics  . Smoking status: Current Some Day Smoker    Types: Cigarettes  . Smokeless tobacco: Never Used  . Alcohol use No  . Drug use: No  . Sexual activity: Not on file   Other Topics Concern  . Not on file   Social History Narrative   Patient works at [employer].    Patient does not do illicit drugs.   Patient lives alone.    Patient is single.    Patient has no children.    Patient has Masters.     FAMILY HISTORY: No family history of any blood clots  ALLERGIES:  has No Known Allergies.  MEDICATIONS:  Current  Outpatient Prescriptions  Medication Sig Dispense Refill  . atorvastatin (LIPITOR) 20 MG tablet Take 1 tablet (20 mg total) by mouth daily.    . divalproex (DEPAKOTE ER) 500 MG 24 hr tablet Take 1 tablet (500 mg total) by mouth 2 (two) times daily. 60 tablet 6  . rivaroxaban (XARELTO) 20 MG TABS tablet Take 1 tablet (20 mg total) by mouth daily with supper. 30 tablet    No current facility-administered medications for this visit.     REVIEW OF SYSTEMS:   Constitutional: Denies fevers, chills or abnormal night sweats Eyes: Denies blurriness of vision, double vision or watery eyes Ears, nose, mouth, throat, and face: Denies mucositis or sore throat Respiratory: Shortness of breath exertion Cardiovascular: Denies palpitation, chest discomfort or lower extremity swelling Gastrointestinal:  Denies nausea, heartburn or change in bowel habits Skin: Denies abnormal skin rashes Lymphatics: Denies new lymphadenopathy or easy bruising Neurological:Denies numbness, tingling or new weaknesses Behavioral/Psych: Mood is stable, no new changes   All other systems were reviewed with the patient and are negative.  PHYSICAL EXAMINATION: ECOG PERFORMANCE STATUS: 1 - Symptomatic but completely ambulatory  Vitals:   04/28/16 1532  BP: (!) 142/76  Pulse: 99  Resp: 19  Temp: 98 F (  36.7 C)   Filed Weights   04/28/16 1532  Weight: 279 lb 1.6 oz (126.6 kg)    GENERAL:alert, no distress and comfortable SKIN: skin color, texture, turgor are normal, no rashes or significant lesions EYES: normal, conjunctiva are pink and non-injected, sclera clear OROPHARYNX:no exudate, no erythema and lips, buccal mucosa, and tongue normal  NECK: supple, thyroid normal size, non-tender, without nodularity LYMPH:  no palpable lymphadenopathy in the cervical, axillary or inguinal LUNGS: clear to auscultation and percussion with normal breathing effort HEART: regular rate & rhythm and no murmurs and no lower extremity  edema ABDOMEN:abdomen soft, non-tender and normal bowel sounds Musculoskeletal:no cyanosis of digits and no clubbing  PSYCH: alert & oriented x 3 with fluent speech NEURO: no focal motor/sensory deficits  LABORATORY DATA:  I have reviewed the data as listed Lab Results  Component Value Date   WBC 5.4 01/12/2013   HGB 14.6 01/12/2013   HCT 43.2 01/12/2013   MCV 90.2 01/12/2013   PLT 243 01/12/2013   Lab Results  Component Value Date   NA 141 01/12/2013   K 3.6 01/12/2013   CL 106 01/12/2013   CO2 25 01/12/2013    RADIOGRAPHIC STUDIES: I have personally reviewed the radiological reports and agreed with the findings in the report.  ASSESSMENT AND PLAN:  Pulmonary embolism ([place]) Pulmonary embolism with left lower extremity DVT diagnosed in October 2017 and was admitted at [facility] helped and treated with blood thinners, currently on Xarelto  Extensive workup done at [facility] one revealed normal results. These included 1. Factor V Leiden 2. prothrombin gene mutation 3. Protein C, protein S, antithrombin III 4. Homocysteine levels 5. Lupus anticoagulant testing  I discussed with the patient that the above inherited risk factors are negative. Lupus anticoagulant was also negative.  Patient's risk factor: 1. Tobacco abuse: With cigars 2. sedentariness 3. Overweight  I discussed with him to stop tobacco abuse and to lose weight and stay active.  I referred him to pulmonology for evaluation of lung function studies because of patient still has shortness of breath. Since the patient has no clear identified risk factor and he is not keen on extended anticoagulation therapy, I recommended that the patient can stop blood thinners at the end of April 2018. If he develops recurrent blood clot then he will need to be on lifelong anticoagulation.  Return to clinic as needed.  All questions were answered. The patient knows to call the clinic with any problems, questions or concerns.     Rulon Eisenmenger, MD 04/28/16

## 2016-05-22 ENCOUNTER — Telehealth: Payer: Self-pay | Admitting: *Deleted

## 2016-05-22 NOTE — Telephone Encounter (Signed)
Patient received a call from [facility] to schedule office visit (May 8th). Patient would like to be seen sooner than that due to his worsening symptoms. Pulmonary office states that patient would not be able to get a sooner office visit unless MD Gudena agrees and contacts them stating this. Please advise.

## 2016-05-27 ENCOUNTER — Encounter: Payer: Self-pay | Admitting: Pulmonary Disease

## 2016-05-27 ENCOUNTER — Ambulatory Visit (INDEPENDENT_AMBULATORY_CARE_PROVIDER_SITE_OTHER): Payer: Federal, State, Local not specified - PPO | Admitting: Pulmonary Disease

## 2016-05-27 VITALS — BP 118/80 | HR 94 | Ht 71.0 in | Wt 277.6 lb

## 2016-05-27 DIAGNOSIS — R0602 Shortness of breath: Secondary | ICD-10-CM

## 2016-05-27 DIAGNOSIS — R0600 Dyspnea, unspecified: Secondary | ICD-10-CM | POA: Diagnosis not present

## 2016-05-27 DIAGNOSIS — I2782 Chronic pulmonary embolism: Secondary | ICD-10-CM

## 2016-05-27 NOTE — Progress Notes (Signed)
Subjective:    Patient ID: Antonio Smith, male    DOB: 1975-10-19, 41 y.o.   MRN: [id number]  HPI  Chief Complaint  Patient presents with  . Pulmonary Consult    Referred by Vedia Coffer, Pt states he had pulmonary emboli in Nov 17, breathing is getting worse more activty and has chest tightness. Occassional cough with blood.     He was diagnosed with left lower extremity DVT and bilateral pulmonary embolism in October 2017 when he presented with sudden onset shortness of breath he was seen by hematology in 04/2016, hypercoagulable workup was negative and it was felt that" could be stopped after 6 months-that is April 2018 but because his shortness of breath was still present he was referred to [place] for evaluation.  I reviewed [facility] CT report [facility] Filling defects were identified within  bilateral pulmonary arteries involving the distal right upper lobar and segmental pulmonary arteries, the right middle lobe pulmonary artery with extension into the medial segmental pulmonary artery, the right lower lobe pulmonary artery with extension into the basilar segmental arteries, and the left lower lobe pulmonary artery with extension into the basilar segmental pulmonary arteries.  Echo was nml including a right-sided pressures  He got married in December and he is gained about 20 pounds in the last few months. He started in exercise program in January and since then has noted increasing dyspnea on exertion. He denies wheezing, cough or sputum production. He denies pedal edema or orthopnea or paroxysmal nocturnal dyspnea or chest pains. He finds that he can barely walk a mile around the track and he is huffing and puffing. He states that he is compliant with xarelto  Spirometry does not show any evidence of airway obstruction with a ratio of 87, FEV1 of 90% and FVC of 84%   Past Medical History:  Diagnosis Date  . Closed head injury    2003  . Seizures ([place])   . Skull fracture ([place])    2003   Past Surgical History:  Procedure Laterality Date  . CRANIECTOMY     2003  . EYE SURGERY    . NECK SURGERY    . STOMACH SURGERY      Allergies  Allergen Reactions  . Prednisone     Light headness and sensitivity       Social History   Social History  . Marital status: Married    Spouse name: N/A  . Number of children: 0  . Years of education: Masters   Occupational History  .  Social Security    Social History Main Topics  . Smoking status: Current Some Day Smoker    Packs/day: 1.00    Types: Cigarettes, Cigars  . Smokeless tobacco: Never Used     Comment: Currently smoking .25 pack daily  . Alcohol use No  . Drug use: No  . Sexual activity: Not on file   Other Topics Concern  . Not on file   Social History Narrative   Patient works at [employer].    Patient does not do illicit drugs.   Patient lives alone.    Patient is single.    Patient has no children.    Patient has Masters.       No family history on file.   Review of Systems  Constitutional: Negative.  Negative for fever and unexpected weight change.  HENT: Negative.  Negative for congestion, dental problem, ear pain, nosebleeds, postnasal drip, rhinorrhea, sinus pressure, sneezing,  sore throat and trouble swallowing.   Eyes: Negative.  Negative for redness and itching.  Respiratory: Positive for cough, chest tightness and shortness of breath. Negative for wheezing.   Cardiovascular: Negative for palpitations and leg swelling.  Gastrointestinal: Negative.  Negative for nausea and vomiting.  Genitourinary: Negative.  Negative for dysuria.  Musculoskeletal: Negative.  Negative for joint swelling.  Skin: Negative.  Negative for rash.  Neurological: Negative.  Negative for headaches.  Hematological: Negative.  Does not bruise/bleed easily.  Psychiatric/Behavioral: Negative.  Negative for dysphoric mood. The patient is not nervous/anxious.        Objective:   Physical Exam   Gen.  Pleasant, obese, in no distress, normal affect ENT - no lesions, no post nasal drip, class 2-3 airway Neck: No JVD, no thyromegaly, no carotid bruits Lungs: no use of accessory muscles, no dullness to percussion, decreased without rales or rhonchi  Cardiovascular: Rhythm regular, heart sounds  normal, no murmurs or gallops, no peripheral edema Abdomen: soft and non-tender, no hepatosplenomegaly, BS normal. Musculoskeletal: No deformities, no cyanosis or clubbing Neuro:  alert, non focal, no tremors        Assessment & Plan:

## 2016-05-27 NOTE — Assessment & Plan Note (Signed)
His lung function appears adequate with no evidence of airway obstruction. No evidence of interstitial lung disease on his CT scan. His echo even at the time of the acute episode did not show elevated RV pressures sits quite unlikely that he would've developed pulmonary hypertension by now.  He has gained 20 pounds in the last few months and  his symptoms may simply be related to deconditioning

## 2016-05-27 NOTE — Assessment & Plan Note (Signed)
Given his persistent dyspnea, we will proceed with VQ scan to check for chronic pulmonary embolism If VQ scan is normal, he can discontinue anticoagulation at the end of this month-which would complete 6 months of anticoagulation

## 2016-05-27 NOTE — Patient Instructions (Signed)
Lung scan will be scheduled Continue graduated exercise program

## 2016-05-27 NOTE — Addendum Note (Signed)
Addended by: Valerie Salts on: 05/27/2016 04:53 PM   Modules accepted: Orders

## 2016-06-05 ENCOUNTER — Ambulatory Visit (HOSPITAL_COMMUNITY)
Admission: RE | Admit: 2016-06-05 | Discharge: 2016-06-05 | Disposition: A | Discharge status: Home / Self Care | Payer: Federal, State, Local not specified - PPO | Source: Ambulatory Visit | Attending: Pulmonary Disease | Admitting: Pulmonary Disease

## 2016-06-05 ENCOUNTER — Encounter (HOSPITAL_COMMUNITY)
Admission: RE | Admit: 2016-06-05 | Discharge: 2016-06-05 | Disposition: A | Discharge status: Home / Self Care | Payer: Federal, State, Local not specified - PPO | Source: Ambulatory Visit | Attending: Pulmonary Disease | Admitting: Pulmonary Disease

## 2016-06-05 DIAGNOSIS — R0600 Dyspnea, unspecified: Secondary | ICD-10-CM | POA: Diagnosis not present

## 2016-06-05 DIAGNOSIS — Z86711 Personal history of pulmonary embolism: Secondary | ICD-10-CM | POA: Insufficient documentation

## 2016-06-05 DIAGNOSIS — I2782 Chronic pulmonary embolism: Secondary | ICD-10-CM

## 2016-06-05 DIAGNOSIS — R079 Chest pain, unspecified: Secondary | ICD-10-CM | POA: Diagnosis not present

## 2016-06-05 DIAGNOSIS — R0602 Shortness of breath: Secondary | ICD-10-CM | POA: Diagnosis not present

## 2016-06-05 MED ORDER — TECHNETIUM TO 99M ALBUMIN AGGREGATED
4.1500 | Freq: Once | INTRAVENOUS | Status: AC | PRN
  Administered 2016-06-05: 4.15 via INTRAVENOUS

## 2016-06-05 MED ORDER — TECHNETIUM TC 99M DIETHYLENETRIAME-PENTAACETIC ACID: 32.0000 | Freq: Once | INTRAVENOUS | Status: DC | PRN

## 2016-07-01 ENCOUNTER — Institutional Professional Consult (permissible substitution): Payer: Federal, State, Local not specified - PPO | Admitting: Internal Medicine

## 2016-10-14 DIAGNOSIS — K080 Exfoliation of teeth due to systemic causes: Secondary | ICD-10-CM | POA: Diagnosis not present

## 2016-10-20 DIAGNOSIS — J209 Acute bronchitis, unspecified: Secondary | ICD-10-CM | POA: Diagnosis not present

## 2016-10-31 ENCOUNTER — Ambulatory Visit
Admission: RE | Admit: 2016-10-31 | Discharge: 2016-10-31 | Disposition: A | Discharge status: Home / Self Care | Payer: Federal, State, Local not specified - PPO | Source: Ambulatory Visit | Attending: Family Medicine | Admitting: Family Medicine

## 2016-10-31 ENCOUNTER — Other Ambulatory Visit: Payer: Self-pay | Admitting: Family Medicine

## 2016-10-31 DIAGNOSIS — J209 Acute bronchitis, unspecified: Secondary | ICD-10-CM

## 2016-10-31 DIAGNOSIS — R05 Cough: Secondary | ICD-10-CM | POA: Diagnosis not present

## 2017-02-10 DIAGNOSIS — Z315 Encounter for genetic counseling: Secondary | ICD-10-CM | POA: Diagnosis not present

## 2017-04-20 ENCOUNTER — Other Ambulatory Visit: Payer: Self-pay | Admitting: Family Medicine

## 2017-04-20 DIAGNOSIS — K080 Exfoliation of teeth due to systemic causes: Secondary | ICD-10-CM | POA: Diagnosis not present

## 2017-04-20 DIAGNOSIS — M79652 Pain in left thigh: Secondary | ICD-10-CM | POA: Diagnosis not present

## 2017-04-22 DIAGNOSIS — M79652 Pain in left thigh: Secondary | ICD-10-CM | POA: Diagnosis not present

## 2017-07-20 DIAGNOSIS — Z79899 Other long term (current) drug therapy: Secondary | ICD-10-CM | POA: Diagnosis not present

## 2017-07-20 DIAGNOSIS — R000 Tachycardia, unspecified: Secondary | ICD-10-CM | POA: Diagnosis not present

## 2017-07-20 DIAGNOSIS — R0600 Dyspnea, unspecified: Secondary | ICD-10-CM | POA: Diagnosis not present

## 2017-07-20 DIAGNOSIS — F17290 Nicotine dependence, other tobacco product, uncomplicated: Secondary | ICD-10-CM | POA: Diagnosis not present

## 2017-07-20 DIAGNOSIS — M79661 Pain in right lower leg: Secondary | ICD-10-CM | POA: Diagnosis not present

## 2017-07-20 DIAGNOSIS — R0602 Shortness of breath: Secondary | ICD-10-CM | POA: Diagnosis not present

## 2017-07-24 DIAGNOSIS — M79604 Pain in right leg: Secondary | ICD-10-CM | POA: Diagnosis not present

## 2017-09-03 DIAGNOSIS — N6452 Nipple discharge: Secondary | ICD-10-CM | POA: Diagnosis not present

## 2017-09-03 DIAGNOSIS — N63 Unspecified lump in unspecified breast: Secondary | ICD-10-CM | POA: Diagnosis not present

## 2017-09-04 ENCOUNTER — Other Ambulatory Visit: Payer: Self-pay | Admitting: Family Medicine

## 2017-09-04 DIAGNOSIS — N6320 Unspecified lump in the left breast, unspecified quadrant: Secondary | ICD-10-CM

## 2017-09-08 ENCOUNTER — Ambulatory Visit
Admission: RE | Admit: 2017-09-08 | Discharge: 2017-09-08 | Disposition: A | Discharge status: Home / Self Care | Payer: Federal, State, Local not specified - PPO | Source: Ambulatory Visit | Attending: Family Medicine | Admitting: Family Medicine

## 2017-09-08 ENCOUNTER — Other Ambulatory Visit: Payer: Self-pay | Admitting: Family Medicine

## 2017-09-08 DIAGNOSIS — R928 Other abnormal and inconclusive findings on diagnostic imaging of breast: Secondary | ICD-10-CM | POA: Diagnosis not present

## 2017-09-08 DIAGNOSIS — N6320 Unspecified lump in the left breast, unspecified quadrant: Secondary | ICD-10-CM

## 2017-09-08 DIAGNOSIS — N6342 Unspecified lump in left breast, subareolar: Secondary | ICD-10-CM | POA: Diagnosis not present

## 2017-09-08 DIAGNOSIS — N6012 Diffuse cystic mastopathy of left breast: Secondary | ICD-10-CM | POA: Diagnosis not present

## 2017-09-08 DIAGNOSIS — N6321 Unspecified lump in the left breast, upper outer quadrant: Secondary | ICD-10-CM | POA: Diagnosis not present

## 2017-09-08 DIAGNOSIS — R590 Localized enlarged lymph nodes: Secondary | ICD-10-CM | POA: Diagnosis not present

## 2017-09-14 ENCOUNTER — Other Ambulatory Visit: Payer: Self-pay | Admitting: Family Medicine

## 2017-09-14 ENCOUNTER — Ambulatory Visit
Admission: RE | Admit: 2017-09-14 | Discharge: 2017-09-14 | Disposition: A | Discharge status: Home / Self Care | Payer: Federal, State, Local not specified - PPO | Source: Ambulatory Visit | Attending: Family Medicine | Admitting: Family Medicine

## 2017-09-14 DIAGNOSIS — N6323 Unspecified lump in the left breast, lower outer quadrant: Secondary | ICD-10-CM | POA: Diagnosis not present

## 2017-09-14 DIAGNOSIS — R229 Localized swelling, mass and lump, unspecified: Principal | ICD-10-CM

## 2017-09-14 DIAGNOSIS — IMO0002 Reserved for concepts with insufficient information to code with codable children: Secondary | ICD-10-CM

## 2017-09-14 DIAGNOSIS — N6321 Unspecified lump in the left breast, upper outer quadrant: Secondary | ICD-10-CM | POA: Diagnosis not present

## 2017-09-21 ENCOUNTER — Ambulatory Visit
Admission: RE | Admit: 2017-09-21 | Discharge: 2017-09-21 | Disposition: A | Discharge status: Home / Self Care | Payer: Federal, State, Local not specified - PPO | Source: Ambulatory Visit | Attending: Family Medicine | Admitting: Family Medicine

## 2017-09-21 DIAGNOSIS — N6489 Other specified disorders of breast: Secondary | ICD-10-CM | POA: Diagnosis not present

## 2017-09-21 DIAGNOSIS — IMO0002 Reserved for concepts with insufficient information to code with codable children: Secondary | ICD-10-CM

## 2017-09-21 DIAGNOSIS — R229 Localized swelling, mass and lump, unspecified: Principal | ICD-10-CM

## 2017-09-28 ENCOUNTER — Ambulatory Visit: Payer: Self-pay | Admitting: Surgery

## 2017-09-28 DIAGNOSIS — N6320 Unspecified lump in the left breast, unspecified quadrant: Secondary | ICD-10-CM | POA: Diagnosis not present

## 2017-09-28 DIAGNOSIS — N6452 Nipple discharge: Secondary | ICD-10-CM | POA: Diagnosis not present

## 2017-09-28 NOTE — H&P (Signed)
History of Present Illness Antonio Smith. Briann Sarchet MD; 09/28/2017 9:53 AM) The patient is a 42 year old male who presents with a complaint of nipple discharge. Referred by Dr. Ammie Ferrier for left nipple discharge PCP - Orland Mustard  This is a 42 year old male in good health who presents with a one-month history discharge from his left nipple. Initially the patient noticed some whitish material coming from the medial part of his left nipple. He described this as threads. A was some odor associated with this. Subsequently he palpated a mass behind his areola at 2:00. He underwent mammogram and ultrasound as well as ultrasound-guided biopsy. This revealed a reactive lymph node and some benign breast tissue with chronic inflammation. The discharge occasionally changes colors. He describes seeing greenish drainage as well as some brown drainage. He presents now for surgical evaluation for excision.  CLINICAL DATA: Patient presents for multiple episodes of bloody left nipple discharge.  EXAM: DIGITAL DIAGNOSTIC BILATERAL MAMMOGRAM WITH CAD AND TOMO  ULTRASOUND LEFT BREAST  COMPARISON: Previous exam(s).  ACR Breast Density Category b: There are scattered areas of fibroglandular density.  FINDINGS: There is a small lobular mass within the retroareolar left breast. Suspected cortically thickened lymph node within the upper-outer left breast. No additional concerning masses, calcifications or distortion identified within either breast.  Mammographic images were processed with CAD.  On physical exam, no nipple discharge elicited with palpation.  Targeted ultrasound is performed, showing a 4 x 8 x 8 mm oval circumscribed hypoechoic mass retroareolar left breast.  Within the left breast 2 o'clock position 7 cm from nipple there is a 5 x 4 x 5 mm lobular hypoechoic mass. This is favored to represent a cortically thickened intramammary lymph node.  No left axillary  adenopathy.  IMPRESSION: 1. Indeterminate retroareolar left breast mass. 2. Indeterminate left breast mass 2 o'clock position, potentially representing a cortically thickened lymph node.  RECOMMENDATION: Ultrasound-guided core needle biopsy retroareolar left breast mass.  Ultrasound-guided core needle biopsy suspected thickened intramammary lymph node left breast.  I have discussed the findings and recommendations with the patient. Results were also provided in writing at the conclusion of the visit. If applicable, a reminder letter will be sent to the patient regarding the next appointment.  BI-RADS CATEGORY 4: Suspicious.   Electronically Signed By: Lovey Newcomer M.D. On: 09/08/2017 09:34  CLINICAL DATA: Post biopsy mammogram of the left breast for clip placement.  EXAM: DIAGNOSTIC LEFT MAMMOGRAM POST ULTRASOUND BIOPSY  COMPARISON: Previous exam(s).  FINDINGS: Mammographic images were obtained following ultrasound guided biopsy of 2 sites in the left breast. The ribbon shaped biopsy marking clip is well positioned in the retroareolar slightly lower inner left breast. The coil shaped biopsy marking clip is well positioned at the site of the biopsied mass in the upper-outer left breast at 2 o'clock.  IMPRESSION: 1. Appropriate positioning of the ribbon shaped biopsy marking clip in the retroareolar left breast.  2. Appropriate positioning of the coil shaped biopsy marking clip in the upper-outer left breast.  Final Assessment: Post Procedure Mammograms for Marker Placement   Electronically Signed By: Ammie Ferrier M.D. On: 09/08/2017 15:56   1. Breast, left, needle core biopsy, retroareolar - BENIGN BREAST TISSUE WITH CHRONIC INFLAMMATION. - NO MALIGNANCY IDENTIFIED. 2. Breast, left, needle core biopsy, 2 o'clock - REACTIVE LYMPH NODE.   Past Surgical History (Tanisha A. Owens Shark, [place]; 09/28/2017 9:17 AM) Breast Biopsy Left.  Diagnostic Studies  History (Tanisha A. Owens Shark, [place]; 09/28/2017 9:17 AM) Colonoscopy never  Allergies Yvetta Coder  Kristian Covey, RMA; 09/28/2017 9:18 AM) No Known Drug Allergies [09/28/2017]: Allergies Reconciled  Medication History (Tanisha A. Owens Shark, [place]; 09/28/2017 9:19 AM) Atorvastatin Calcium (20MG  Tablet, Oral) Active. Medications Reconciled  Social History (Tanisha A. Owens Shark, [place]; 09/28/2017 9:17 AM) Alcohol use Occasional alcohol use. Caffeine use Tea. No drug use Tobacco use Current some day smoker.  Family History (Tanisha A. Owens Shark, [place]; 09/28/2017 9:17 AM) Cervical Cancer Mother. Colon Polyps Father. Diabetes Mellitus Father. Hypertension Father, Mother. Ovarian Cancer Mother. Prostate Cancer Father.  Other Problems (Tanisha A. Owens Shark, [place]; 09/28/2017 9:17 AM) Back Pain Chest pain General anesthesia - complications Hypercholesterolemia Pulmonary Embolism / Blood Clot in Legs Seizure Disorder     Review of Systems (Tanisha A. Brown RMA; 09/28/2017 9:17 AM) General Present- Weight Loss. Not Present- Appetite Loss, Chills, Fatigue, Fever, Night Sweats and Weight Gain. Skin Not Present- Change in Wart/Mole, Dryness, Hives, Jaundice, New Lesions, Non-Healing Wounds, Rash and Ulcer. HEENT Present- Wears glasses/contact lenses. Not Present- Earache, Hearing Loss, Hoarseness, Nose Bleed, Oral Ulcers, Ringing in the Ears, Seasonal Allergies, Sinus Pain, Sore Throat, Visual Disturbances and Yellow Eyes. Respiratory Not Present- Bloody sputum, Chronic Cough, Difficulty Breathing, Snoring and Wheezing. Breast Present- Breast Mass, Breast Pain, Nipple Discharge and Skin Changes. Cardiovascular Not Present- Chest Pain, Difficulty Breathing Lying Down, Leg Cramps, Palpitations, Rapid Heart Rate, Shortness of Breath and Swelling of Extremities. Gastrointestinal Not Present- Abdominal Pain, Bloating, Bloody Stool, Change in Bowel Habits, Chronic diarrhea, Constipation, Difficulty Swallowing, Excessive  gas, Gets full quickly at meals, Hemorrhoids, Indigestion, Nausea, Rectal Pain and Vomiting. Male Genitourinary Not Present- Blood in Urine, Change in Urinary Stream, Frequency, Impotence, Nocturia, Painful Urination, Urgency and Urine Leakage.  Vitals (Tanisha A. Brown RMA; 09/28/2017 9:18 AM) 09/28/2017 9:18 AM Weight: 272.4 lb Height: 71in Body Surface Area: 2.4 m Body Mass Index: 37.99 kg/m  Temp.: 98.81F  Pulse: 98 (Regular)  BP: 132/84 (Sitting, Left Arm, Standard)      Physical Exam Antonio Key K. Leotta Weingarten MD; 09/28/2017 9:54 AM)  The physical exam findings are as follows: Note:Well-developed well-nourished in no apparent distress The right chest shows no palpable masses. The left chest is not visually asymmetric. Currently there is no discharge. The patient describes seeing some drainage coming from the medial portion of the retroareolar region. He has a 1 cm palpable area in the upper outer retroareolar area at 2:00. Currently no inflammation. Minimal tenderness. No axillary lymphadenopathy on either side.    Assessment & Plan Antonio Smith. March Joos MD; 09/28/2017 9:55 AM)  LEFT BREAST MASS (N63.20)   BLOODY DISCHARGE FROM LEFT NIPPLE (N64.52)  Current Plans Schedule for Surgery - excision of left breast mass. The surgical procedure has been discussed with the patient. Potential risks, benefits, alternative treatments, and expected outcomes have been explained. All of the patient's questions at this time have been answered. The likelihood of reaching the patient's treatment goal is good. The patient understand the proposed surgical procedure and wishes to proceed. Note:With his description of the whitish drainage and greenish drainage, this likely represents a ruptured sebaceous cysts. This is consistent with the findings seen on ultrasound with chronic inflammation as well as a reactive lymph node. However there is some concern raised when he describes bloody drainage.  I would recommend excision of all of the retroareolar breast tissue to include both biopsy clips. This will be sent for pathology to confirm that it is all benign.  Antonio Smith. Georgette Dover, MD, [facility]  General/ Trauma Surgery  09/28/2017 9:55 AM

## 2017-09-28 NOTE — H&P (View-Only) (Signed)
History of Present Illness Imogene Burn. Danasha Melman MD; 09/28/2017 9:53 AM) The patient is a 42 year old male who presents with a complaint of nipple discharge. Referred by Dr. Ammie Ferrier for left nipple discharge PCP - Orland Mustard  This is a 42 year old male in good health who presents with a one-month history discharge from his left nipple. Initially the patient noticed some whitish material coming from the medial part of his left nipple. He described this as threads. A was some odor associated with this. Subsequently he palpated a mass behind his areola at 2:00. He underwent mammogram and ultrasound as well as ultrasound-guided biopsy. This revealed a reactive lymph node and some benign breast tissue with chronic inflammation. The discharge occasionally changes colors. He describes seeing greenish drainage as well as some brown drainage. He presents now for surgical evaluation for excision.  CLINICAL DATA: Patient presents for multiple episodes of bloody left nipple discharge.  EXAM: DIGITAL DIAGNOSTIC BILATERAL MAMMOGRAM WITH CAD AND TOMO  ULTRASOUND LEFT BREAST  COMPARISON: Previous exam(s).  ACR Breast Density Category b: There are scattered areas of fibroglandular density.  FINDINGS: There is a small lobular mass within the retroareolar left breast. Suspected cortically thickened lymph node within the upper-outer left breast. No additional concerning masses, calcifications or distortion identified within either breast.  Mammographic images were processed with CAD.  On physical exam, no nipple discharge elicited with palpation.  Targeted ultrasound is performed, showing a 4 x 8 x 8 mm oval circumscribed hypoechoic mass retroareolar left breast.  Within the left breast 2 o'clock position 7 cm from nipple there is a 5 x 4 x 5 mm lobular hypoechoic mass. This is favored to represent a cortically thickened intramammary lymph node.  No left axillary  adenopathy.  IMPRESSION: 1. Indeterminate retroareolar left breast mass. 2. Indeterminate left breast mass 2 o'clock position, potentially representing a cortically thickened lymph node.  RECOMMENDATION: Ultrasound-guided core needle biopsy retroareolar left breast mass.  Ultrasound-guided core needle biopsy suspected thickened intramammary lymph node left breast.  I have discussed the findings and recommendations with the patient. Results were also provided in writing at the conclusion of the visit. If applicable, a reminder letter will be sent to the patient regarding the next appointment.  BI-RADS CATEGORY 4: Suspicious.   Electronically Signed By: Lovey Newcomer M.D. On: 09/08/2017 09:34  CLINICAL DATA: Post biopsy mammogram of the left breast for clip placement.  EXAM: DIAGNOSTIC LEFT MAMMOGRAM POST ULTRASOUND BIOPSY  COMPARISON: Previous exam(s).  FINDINGS: Mammographic images were obtained following ultrasound guided biopsy of 2 sites in the left breast. The ribbon shaped biopsy marking clip is well positioned in the retroareolar slightly lower inner left breast. The coil shaped biopsy marking clip is well positioned at the site of the biopsied mass in the upper-outer left breast at 2 o'clock.  IMPRESSION: 1. Appropriate positioning of the ribbon shaped biopsy marking clip in the retroareolar left breast.  2. Appropriate positioning of the coil shaped biopsy marking clip in the upper-outer left breast.  Final Assessment: Post Procedure Mammograms for Marker Placement   Electronically Signed By: Ammie Ferrier M.D. On: 09/08/2017 15:56   1. Breast, left, needle core biopsy, retroareolar - BENIGN BREAST TISSUE WITH CHRONIC INFLAMMATION. - NO MALIGNANCY IDENTIFIED. 2. Breast, left, needle core biopsy, 2 o'clock - REACTIVE LYMPH NODE.   Past Surgical History (Tanisha A. Owens Shark, [place]; 09/28/2017 9:17 AM) Breast Biopsy Left.  Diagnostic Studies  History (Tanisha A. Owens Shark, [place]; 09/28/2017 9:17 AM) Colonoscopy never  Allergies Yvetta Coder  Kristian Covey, RMA; 09/28/2017 9:18 AM) No Known Drug Allergies [09/28/2017]: Allergies Reconciled  Medication History (Tanisha A. Owens Shark, [place]; 09/28/2017 9:19 AM) Atorvastatin Calcium (20MG  Tablet, Oral) Active. Medications Reconciled  Social History (Tanisha A. Owens Shark, [employer]; 09/28/2017 9:17 AM) Alcohol use Occasional alcohol use. Caffeine use Tea. No drug use Tobacco use Current some day smoker.  Family History (Tanisha A. Owens Shark, [place]; 09/28/2017 9:17 AM) Cervical Cancer Mother. Colon Polyps Father. Diabetes Mellitus Father. Hypertension Father, Mother. Ovarian Cancer Mother. Prostate Cancer Father.  Other Problems (Tanisha A. Owens Shark, [place]; 09/28/2017 9:17 AM) Back Pain Chest pain General anesthesia - complications Hypercholesterolemia Pulmonary Embolism / Blood Clot in Legs Seizure Disorder     Review of Systems (Tanisha A. Brown RMA; 09/28/2017 9:17 AM) General Present- Weight Loss. Not Present- Appetite Loss, Chills, Fatigue, Fever, Night Sweats and Weight Gain. Skin Not Present- Change in Wart/Mole, Dryness, Hives, Jaundice, New Lesions, Non-Healing Wounds, Rash and Ulcer. HEENT Present- Wears glasses/contact lenses. Not Present- Earache, Hearing Loss, Hoarseness, Nose Bleed, Oral Ulcers, Ringing in the Ears, Seasonal Allergies, Sinus Pain, Sore Throat, Visual Disturbances and Yellow Eyes. Respiratory Not Present- Bloody sputum, Chronic Cough, Difficulty Breathing, Snoring and Wheezing. Breast Present- Breast Mass, Breast Pain, Nipple Discharge and Skin Changes. Cardiovascular Not Present- Chest Pain, Difficulty Breathing Lying Down, Leg Cramps, Palpitations, Rapid Heart Rate, Shortness of Breath and Swelling of Extremities. Gastrointestinal Not Present- Abdominal Pain, Bloating, Bloody Stool, Change in Bowel Habits, Chronic diarrhea, Constipation, Difficulty Swallowing, Excessive  gas, Gets full quickly at meals, Hemorrhoids, Indigestion, Nausea, Rectal Pain and Vomiting. Male Genitourinary Not Present- Blood in Urine, Change in Urinary Stream, Frequency, Impotence, Nocturia, Painful Urination, Urgency and Urine Leakage.  Vitals (Tanisha A. Brown RMA; 09/28/2017 9:18 AM) 09/28/2017 9:18 AM Weight: 272.4 lb Height: 71in Body Surface Area: 2.4 m Body Mass Index: 37.99 kg/m  Temp.: 98.62F  Pulse: 98 (Regular)  BP: 132/84 (Sitting, Left Arm, Standard)      Physical Exam Rodman Key K. Analis Distler MD; 09/28/2017 9:54 AM)  The physical exam findings are as follows: Note:Well-developed well-nourished in no apparent distress The right chest shows no palpable masses. The left chest is not visually asymmetric. Currently there is no discharge. The patient describes seeing some drainage coming from the medial portion of the retroareolar region. He has a 1 cm palpable area in the upper outer retroareolar area at 2:00. Currently no inflammation. Minimal tenderness. No axillary lymphadenopathy on either side.    Assessment & Plan Imogene Burn. Dashanae Longfield MD; 09/28/2017 9:55 AM)  LEFT BREAST MASS (N63.20)   BLOODY DISCHARGE FROM LEFT NIPPLE (N64.52)  Current Plans Schedule for Surgery - excision of left breast mass. The surgical procedure has been discussed with the patient. Potential risks, benefits, alternative treatments, and expected outcomes have been explained. All of the patient's questions at this time have been answered. The likelihood of reaching the patient's treatment goal is good. The patient understand the proposed surgical procedure and wishes to proceed. Note:With his description of the whitish drainage and greenish drainage, this likely represents a ruptured sebaceous cysts. This is consistent with the findings seen on ultrasound with chronic inflammation as well as a reactive lymph node. However there is some concern raised when he describes bloody drainage.  I would recommend excision of all of the retroareolar breast tissue to include both biopsy clips. This will be sent for pathology to confirm that it is all benign.  Imogene Burn. Georgette Dover, MD, [facility]  General/ Trauma Surgery  09/28/2017 9:55 AM

## 2017-10-19 NOTE — Pre-Procedure Instructions (Signed)
LUCY WOOLEVER  10/19/2017      [id number] [address], [address] [place] [address] Phone: [phone] Fax: [phone]    Your procedure is scheduled on Wednesday September 4th.  Report to [facility] Admitting at 0830 A.M.  Call this number if you have problems the morning of surgery:  [phone]   Remember:  Do not eat or drink after midnight.     Take these medicines the morning of surgery with A SIP OF WATER   Atorvastatin  Depakote  7 days prior to surgery STOP taking any Aspirin(unless otherwise instructed by your surgeon), Aleve, Naproxen, Ibuprofen, Motrin, Advil, Goody's, BC's, all herbal medications, fish oil, and all vitamins     Do not wear jewelry  Do not wear lotions, powders, or colognes, or deodorant.  Do not shave 48 hours prior to surgery.  Men may shave face and neck.  Do not bring valuables to the hospital.  [facility] is not responsible for any belongings or valuables.  Contacts, dentures or bridgework may not be worn into surgery.  Leave your suitcase in the car.  After surgery it may be brought to your room.  For patients admitted to the hospital, discharge time will be determined by your treatment team.  Patients discharged the day of surgery will not be allowed to drive home.    [place]- Preparing For Surgery  Before surgery, you can play an important role. Because skin is not sterile, your skin needs to be as free of germs as possible. You can reduce the number of germs on your skin by washing with CHG (chlorahexidine gluconate) Soap before surgery.  CHG is an antiseptic cleaner which kills germs and bonds with the skin to continue killing germs even after washing.    Oral Hygiene is also important to reduce your risk of infection.  Remember - BRUSH YOUR TEETH THE MORNING OF SURGERY WITH YOUR REGULAR TOOTHPASTE  Please do  not use if you have an allergy to CHG or antibacterial soaps. If your skin becomes reddened/irritated stop using the CHG.  Do not shave (including legs and underarms) for at least 48 hours prior to first CHG shower. It is OK to shave your face.  Please follow these instructions carefully.   1. Shower the NIGHT BEFORE SURGERY and the MORNING OF SURGERY with CHG.   2. If you chose to wash your hair, wash your hair first as usual with your normal shampoo.  3. After you shampoo, rinse your hair and body thoroughly to remove the shampoo.  4. Use CHG as you would any other liquid soap. You can apply CHG directly to the skin and wash gently with a scrungie or a clean washcloth.   5. Apply the CHG Soap to your body ONLY FROM THE NECK DOWN.  Do not use on open wounds or open sores. Avoid contact with your eyes, ears, mouth and genitals (private parts). Wash Face and genitals (private parts)  with your normal soap.  6. Wash thoroughly, paying special attention to the area where your surgery will be performed.  7. Thoroughly rinse your body with warm water from the neck down.  8. DO NOT shower/wash with your normal soap after using and rinsing off the CHG Soap.  9. Pat yourself dry with a CLEAN TOWEL.  10. Wear CLEAN PAJAMAS to bed the night before surgery, wear comfortable  clothes the morning of surgery  11. Place CLEAN SHEETS on your bed the night of your first shower and DO NOT SLEEP WITH PETS.    Day of Surgery:  Do not apply any deodorants/lotions.  Please wear clean clothes to the hospital/surgery center.   Remember to brush your teeth WITH YOUR REGULAR TOOTHPASTE.    Please read over the following fact sheets that you were given. MRSA Information and Surgical Site Infection Prevention

## 2017-10-20 ENCOUNTER — Encounter (HOSPITAL_COMMUNITY)
Admission: RE | Admit: 2017-10-20 | Discharge: 2017-10-20 | Disposition: A | Discharge status: Home / Self Care | Payer: Federal, State, Local not specified - PPO | Source: Ambulatory Visit | Attending: Surgery | Admitting: Surgery

## 2017-10-20 ENCOUNTER — Other Ambulatory Visit: Payer: Self-pay

## 2017-10-20 ENCOUNTER — Encounter (HOSPITAL_COMMUNITY): Payer: Self-pay

## 2017-10-20 DIAGNOSIS — Z01812 Encounter for preprocedural laboratory examination: Secondary | ICD-10-CM | POA: Insufficient documentation

## 2017-10-20 HISTORY — DX: Gastro-esophageal reflux disease without esophagitis: K21.9

## 2017-10-20 HISTORY — DX: Other complications of anesthesia, initial encounter: T88.59XA

## 2017-10-20 HISTORY — DX: Unspecified osteoarthritis, unspecified site: M19.90

## 2017-10-20 HISTORY — DX: Adverse effect of unspecified anesthetic, initial encounter: T41.45XA

## 2017-10-20 HISTORY — DX: Headache, unspecified: R51.9

## 2017-10-20 HISTORY — DX: Other pulmonary embolism without acute cor pulmonale: I26.99

## 2017-10-20 HISTORY — DX: Headache: R51

## 2017-10-20 LAB — CBC
HCT: 44.7 % (ref 39.0–52.0)
HEMOGLOBIN: 14.7 g/dL (ref 13.0–17.0)
MCH: 30.9 pg (ref 26.0–34.0)
MCHC: 32.9 g/dL (ref 30.0–36.0)
MCV: 93.9 fL (ref 78.0–100.0)
Platelets: 226 10*3/uL (ref 150–400)
RBC: 4.76 MIL/uL (ref 4.22–5.81)
RDW: 12.6 % (ref 11.5–15.5)
WBC: 5.9 10*3/uL (ref 4.0–10.5)

## 2017-10-20 NOTE — Progress Notes (Signed)
PCP -  London Pepper MD  Chest x-ray -  10/2016 EKG - 01/2012 Echo - 2017  Anesthesia review: none  Patient denies shortness of breath, fever, cough and chest pain at PAT appointment   Patient verbalized understanding of instructions that were given to them at the PAT appointment. Patient was also instructed that they will need to review over the PAT instructions again at home before surgery.

## 2017-10-21 DIAGNOSIS — K080 Exfoliation of teeth due to systemic causes: Secondary | ICD-10-CM | POA: Diagnosis not present

## 2017-10-27 MED ORDER — DEXTROSE 5 % IV SOLN
3.0000 g | INTRAVENOUS | Status: AC
  Administered 2017-10-28: 3 g via INTRAVENOUS
  Filled 2017-10-27: qty 3

## 2017-10-28 ENCOUNTER — Encounter (HOSPITAL_COMMUNITY): Payer: Self-pay

## 2017-10-28 ENCOUNTER — Ambulatory Visit (HOSPITAL_COMMUNITY): Payer: Federal, State, Local not specified - PPO | Admitting: Anesthesiology

## 2017-10-28 ENCOUNTER — Ambulatory Visit (HOSPITAL_COMMUNITY)
Admission: RE | Admit: 2017-10-28 | Discharge: 2017-10-28 | Disposition: A | Discharge status: Home / Self Care | Payer: Federal, State, Local not specified - PPO | Source: Ambulatory Visit | Attending: Surgery | Admitting: Surgery

## 2017-10-28 ENCOUNTER — Encounter (HOSPITAL_COMMUNITY)
Admission: RE | Disposition: A | Discharge status: Home / Self Care | Payer: Self-pay | Source: Ambulatory Visit | Attending: Surgery

## 2017-10-28 DIAGNOSIS — N6452 Nipple discharge: Secondary | ICD-10-CM | POA: Diagnosis not present

## 2017-10-28 DIAGNOSIS — N6320 Unspecified lump in the left breast, unspecified quadrant: Secondary | ICD-10-CM | POA: Diagnosis not present

## 2017-10-28 DIAGNOSIS — K219 Gastro-esophageal reflux disease without esophagitis: Secondary | ICD-10-CM | POA: Diagnosis not present

## 2017-10-28 DIAGNOSIS — Z79899 Other long term (current) drug therapy: Secondary | ICD-10-CM | POA: Diagnosis not present

## 2017-10-28 DIAGNOSIS — D242 Benign neoplasm of left breast: Secondary | ICD-10-CM | POA: Insufficient documentation

## 2017-10-28 HISTORY — PX: MASS EXCISION: SHX2000

## 2017-10-28 SURGERY — EXCISION MASS
Anesthesia: General | Site: Breast | Laterality: Left

## 2017-10-28 MED ORDER — LIDOCAINE 2% (20 MG/ML) 5 ML SYRINGE
INTRAMUSCULAR | Status: DC | PRN
  Administered 2017-10-28 (×2): 20 mg via INTRAVENOUS
  Administered 2017-10-28: 100 mg via INTRAVENOUS

## 2017-10-28 MED ORDER — 0.9 % SODIUM CHLORIDE (POUR BTL) OPTIME
TOPICAL | Status: DC | PRN
  Administered 2017-10-28: 1000 mL

## 2017-10-28 MED ORDER — PROPOFOL 10 MG/ML IV BOLUS
INTRAVENOUS | Status: AC
  Filled 2017-10-28: qty 20

## 2017-10-28 MED ORDER — PHENYLEPHRINE 40 MCG/ML (10ML) SYRINGE FOR IV PUSH (FOR BLOOD PRESSURE SUPPORT)
PREFILLED_SYRINGE | INTRAVENOUS | Status: AC
  Filled 2017-10-28: qty 20

## 2017-10-28 MED ORDER — PHENYLEPHRINE 40 MCG/ML (10ML) SYRINGE FOR IV PUSH (FOR BLOOD PRESSURE SUPPORT)
PREFILLED_SYRINGE | INTRAVENOUS | Status: AC
  Filled 2017-10-28: qty 10

## 2017-10-28 MED ORDER — CELECOXIB 200 MG PO CAPS
200.0000 mg | ORAL_CAPSULE | ORAL | Status: AC
  Administered 2017-10-28: 200 mg via ORAL
  Filled 2017-10-28: qty 1

## 2017-10-28 MED ORDER — HYDROCODONE-ACETAMINOPHEN 5-325 MG PO TABS
1.0000 | ORAL_TABLET | Freq: Once | ORAL | Status: AC
  Administered 2017-10-28: 1 via ORAL

## 2017-10-28 MED ORDER — LIDOCAINE 2% (20 MG/ML) 5 ML SYRINGE
INTRAMUSCULAR | Status: AC
  Filled 2017-10-28: qty 10

## 2017-10-28 MED ORDER — ONDANSETRON HCL 4 MG/2ML IJ SOLN
INTRAMUSCULAR | Status: AC
  Filled 2017-10-28: qty 2

## 2017-10-28 MED ORDER — LACTATED RINGERS IV SOLN
INTRAVENOUS | Status: DC | PRN
  Administered 2017-10-28: 10:00:00 via INTRAVENOUS

## 2017-10-28 MED ORDER — CHLORHEXIDINE GLUCONATE CLOTH 2 % EX PADS: 6.0000 | MEDICATED_PAD | Freq: Once | CUTANEOUS | Status: DC

## 2017-10-28 MED ORDER — PHENYLEPHRINE HCL 10 MG/ML IJ SOLN
INTRAMUSCULAR | Status: DC | PRN
  Administered 2017-10-28 (×2): 40 ug via INTRAVENOUS

## 2017-10-28 MED ORDER — FENTANYL CITRATE (PF) 100 MCG/2ML IJ SOLN
25.0000 ug | INTRAMUSCULAR | Status: DC | PRN
  Administered 2017-10-28 (×2): 25 ug via INTRAVENOUS
  Administered 2017-10-28: 50 ug via INTRAVENOUS

## 2017-10-28 MED ORDER — FENTANYL CITRATE (PF) 100 MCG/2ML IJ SOLN
INTRAMUSCULAR | Status: DC | PRN
  Administered 2017-10-28: 50 ug via INTRAVENOUS
  Administered 2017-10-28: 25 ug via INTRAVENOUS
  Administered 2017-10-28: 50 ug via INTRAVENOUS

## 2017-10-28 MED ORDER — OXYCODONE HCL 5 MG PO TABS
ORAL_TABLET | ORAL | Status: AC
  Filled 2017-10-28: qty 1

## 2017-10-28 MED ORDER — ONDANSETRON HCL 4 MG/2ML IJ SOLN
4.0000 mg | Freq: Four times a day (QID) | INTRAMUSCULAR | Status: AC | PRN
  Administered 2017-10-28: 4 mg via INTRAVENOUS

## 2017-10-28 MED ORDER — BUPIVACAINE-EPINEPHRINE (PF) 0.25% -1:200000 IJ SOLN
INTRAMUSCULAR | Status: AC
  Filled 2017-10-28: qty 30

## 2017-10-28 MED ORDER — SUCCINYLCHOLINE CHLORIDE 200 MG/10ML IV SOSY
PREFILLED_SYRINGE | INTRAVENOUS | Status: AC
  Filled 2017-10-28: qty 10

## 2017-10-28 MED ORDER — OXYCODONE HCL 5 MG PO TABS
5.0000 mg | ORAL_TABLET | Freq: Once | ORAL | Status: AC | PRN
  Administered 2017-10-28: 5 mg via ORAL

## 2017-10-28 MED ORDER — OXYCODONE HCL 5 MG/5ML PO SOLN: 5.0000 mg | Freq: Once | ORAL | Status: AC | PRN

## 2017-10-28 MED ORDER — ONDANSETRON HCL 4 MG/2ML IJ SOLN
INTRAMUSCULAR | Status: DC | PRN
  Administered 2017-10-28: 4 mg via INTRAVENOUS

## 2017-10-28 MED ORDER — MIDAZOLAM HCL 2 MG/2ML IJ SOLN
INTRAMUSCULAR | Status: AC
  Filled 2017-10-28: qty 2

## 2017-10-28 MED ORDER — FENTANYL CITRATE (PF) 250 MCG/5ML IJ SOLN
INTRAMUSCULAR | Status: AC
  Filled 2017-10-28: qty 5

## 2017-10-28 MED ORDER — MIDAZOLAM HCL 5 MG/5ML IJ SOLN
INTRAMUSCULAR | Status: DC | PRN
  Administered 2017-10-28: 2 mg via INTRAVENOUS

## 2017-10-28 MED ORDER — GABAPENTIN 300 MG PO CAPS
300.0000 mg | ORAL_CAPSULE | ORAL | Status: AC
  Administered 2017-10-28: 300 mg via ORAL
  Filled 2017-10-28: qty 1

## 2017-10-28 MED ORDER — LIDOCAINE 2% (20 MG/ML) 5 ML SYRINGE
INTRAMUSCULAR | Status: AC
  Filled 2017-10-28: qty 5

## 2017-10-28 MED ORDER — PROPOFOL 10 MG/ML IV BOLUS
INTRAVENOUS | Status: DC | PRN
  Administered 2017-10-28: 200 mg via INTRAVENOUS

## 2017-10-28 MED ORDER — HYDROCODONE-ACETAMINOPHEN 5-325 MG PO TABS: 1.0000 | ORAL_TABLET | Freq: Four times a day (QID) | ORAL | 0 refills | Status: DC | PRN

## 2017-10-28 MED ORDER — ROCURONIUM BROMIDE 50 MG/5ML IV SOSY
PREFILLED_SYRINGE | INTRAVENOUS | Status: AC
  Filled 2017-10-28: qty 5

## 2017-10-28 MED ORDER — EPHEDRINE SULFATE 50 MG/ML IJ SOLN
INTRAMUSCULAR | Status: DC | PRN
  Administered 2017-10-28: 10 mg via INTRAVENOUS

## 2017-10-28 MED ORDER — FENTANYL CITRATE (PF) 100 MCG/2ML IJ SOLN
INTRAMUSCULAR | Status: AC
  Filled 2017-10-28: qty 2

## 2017-10-28 MED ORDER — ACETAMINOPHEN 500 MG PO TABS
1000.0000 mg | ORAL_TABLET | ORAL | Status: AC
  Administered 2017-10-28: 1000 mg via ORAL
  Filled 2017-10-28: qty 2

## 2017-10-28 MED ORDER — HYDROCODONE-ACETAMINOPHEN 5-325 MG PO TABS
ORAL_TABLET | ORAL | Status: AC
  Filled 2017-10-28: qty 1

## 2017-10-28 MED ORDER — BUPIVACAINE-EPINEPHRINE 0.25% -1:200000 IJ SOLN
INTRAMUSCULAR | Status: DC | PRN
  Administered 2017-10-28: 30 mL

## 2017-10-28 SURGICAL SUPPLY — 42 items
APL SKNCLS STERI-STRIP NONHPOA (GAUZE/BANDAGES/DRESSINGS) ×1
BENZOIN TINCTURE PRP APPL 2/3 (GAUZE/BANDAGES/DRESSINGS) ×1 IMPLANT
BLADE CLIPPER SURG (BLADE) ×1 IMPLANT
CHLORAPREP W/TINT 26ML (MISCELLANEOUS) ×1 IMPLANT
COVER SURGICAL LIGHT HANDLE (MISCELLANEOUS) ×2 IMPLANT
DRAPE LAPAROTOMY 100X72 PEDS (DRAPES) ×2 IMPLANT
DRAPE UTILITY XL STRL (DRAPES) ×1 IMPLANT
DRSG TEGADERM 4X4.75 (GAUZE/BANDAGES/DRESSINGS) ×1 IMPLANT
ELECT BLADE 4.0 EZ CLEAN MEGAD (MISCELLANEOUS) ×2
ELECT CAUTERY BLADE 6.4 (BLADE) ×2 IMPLANT
ELECT REM PT RETURN 9FT ADLT (ELECTROSURGICAL) ×2
ELECTRODE BLDE 4.0 EZ CLN MEGD (MISCELLANEOUS) IMPLANT
ELECTRODE REM PT RTRN 9FT ADLT (ELECTROSURGICAL) ×1 IMPLANT
GAUZE SPONGE 4X4 12PLY STRL (GAUZE/BANDAGES/DRESSINGS) ×1 IMPLANT
GLOVE BIO SURGEON STRL SZ7 (GLOVE) ×2 IMPLANT
GLOVE BIOGEL PI IND STRL 7.5 (GLOVE) ×1 IMPLANT
GLOVE BIOGEL PI INDICATOR 7.5 (GLOVE) ×1
GOWN STRL REUS W/ TWL LRG LVL3 (GOWN DISPOSABLE) ×2 IMPLANT
GOWN STRL REUS W/TWL LRG LVL3 (GOWN DISPOSABLE) ×4
KIT BASIN OR (CUSTOM PROCEDURE TRAY) ×2 IMPLANT
KIT MARKER MARGIN INK (KITS) ×1 IMPLANT
KIT TURNOVER KIT B (KITS) ×2 IMPLANT
NDL HYPO 25GX1X1/2 BEV (NEEDLE) ×1 IMPLANT
NEEDLE HYPO 25GX1X1/2 BEV (NEEDLE) ×2 IMPLANT
NS IRRIG 1000ML POUR BTL (IV SOLUTION) ×2 IMPLANT
PACK GENERAL/GYN (CUSTOM PROCEDURE TRAY) ×1 IMPLANT
PACK SURGICAL SETUP 50X90 (CUSTOM PROCEDURE TRAY) IMPLANT
PAD ARMBOARD 7.5X6 YLW CONV (MISCELLANEOUS) ×2 IMPLANT
PENCIL BUTTON HOLSTER BLD 10FT (ELECTRODE) ×1 IMPLANT
PENCIL SMOKE EVACUATOR (MISCELLANEOUS) ×1 IMPLANT
SLEEVE SUCTION 125 (MISCELLANEOUS) ×1 IMPLANT
SPECIMEN JAR SMALL (MISCELLANEOUS) ×1 IMPLANT
SPONGE LAP 18X18 X RAY DECT (DISPOSABLE) IMPLANT
STRIP CLOSURE SKIN 1/2X4 (GAUZE/BANDAGES/DRESSINGS) ×2 IMPLANT
SUT MNCRL AB 4-0 PS2 18 (SUTURE) ×2 IMPLANT
SUT VIC AB 2-0 SH 27 (SUTURE) ×2
SUT VIC AB 2-0 SH 27X BRD (SUTURE) ×1 IMPLANT
SUT VIC AB 3-0 SH 27 (SUTURE) ×2
SUT VIC AB 3-0 SH 27XBRD (SUTURE) ×1 IMPLANT
SYR CONTROL 10ML LL (SYRINGE) ×2 IMPLANT
TOWEL OR 17X24 6PK STRL BLUE (TOWEL DISPOSABLE) ×2 IMPLANT
TOWEL OR 17X26 10 PK STRL BLUE (TOWEL DISPOSABLE) ×2 IMPLANT

## 2017-10-28 NOTE — Transfer of Care (Signed)
Immediate Anesthesia Transfer of Care Note  Patient: Antonio Smith  Procedure(s) Performed: EXCISION LEFT BREAST MASS (Left Breast)  Patient Location: PACU  Anesthesia Type:General  Level of Consciousness: awake, alert  and oriented  Airway & Oxygen Therapy: Patient connected to nasal cannula oxygen  Post-op Assessment: Post -op Vital signs reviewed and stable  Post vital signs: stable  Last Vitals:  Vitals Value Taken Time  BP    Temp    Pulse 80 10/28/2017 11:14 AM  Resp    SpO2 100 % 10/28/2017 11:14 AM  Vitals shown include unvalidated device data.  Last Pain:  Vitals:   10/28/17 0909  TempSrc:   PainSc: 4       Patients Stated Pain Goal: 3 (96/22/29 7989)  Complications: No apparent anesthesia complications

## 2017-10-28 NOTE — Anesthesia Preprocedure Evaluation (Signed)
Anesthesia Evaluation  Patient identified by MRN, date of birth, ID band Patient awake    Reviewed: Allergy & Precautions, H&P , NPO status , Patient's Chart, lab work & pertinent test results  Airway Mallampati: II   Neck ROM: full    Dental   Pulmonary Current Smoker,    breath sounds clear to auscultation       Cardiovascular negative cardio ROS   Rhythm:regular Rate:Normal     Neuro/Psych  Headaches, Seizures -,     GI/Hepatic GERD  ,  Endo/Other    Renal/GU      Musculoskeletal  (+) Arthritis ,   Abdominal   Peds  Hematology   Anesthesia Other Findings   Reproductive/Obstetrics                             Anesthesia Physical Anesthesia Plan  ASA: II  Anesthesia Plan: General   Post-op Pain Management:    Induction: Intravenous  PONV Risk Score and Plan: 1  Airway Management Planned: LMA  Additional Equipment:   Intra-op Plan:   Post-operative Plan:   Informed Consent: I have reviewed the patients History and Physical, chart, labs and discussed the procedure including the risks, benefits and alternatives for the proposed anesthesia with the patient or authorized representative who has indicated his/her understanding and acceptance.     Plan Discussed with: CRNA, Anesthesiologist and Surgeon  Anesthesia Plan Comments:         Anesthesia Quick Evaluation

## 2017-10-28 NOTE — Interval H&P Note (Signed)
History and Physical Interval Note:  10/28/2017 9:24 AM  Antonio Smith  has presented today for surgery, with the diagnosis of LEFT BREAST MASS, NIPPLE DISCHARGE  The various methods of treatment have been discussed with the patient and family. After consideration of risks, benefits and other options for treatment, the patient has consented to  Procedure(s): EXCISION LEFT BREAST MASS (Left) as a surgical intervention .  The patient's history has been reviewed, patient examined, no change in status, stable for surgery.  I have reviewed the patient's chart and labs.  Questions were answered to the patient's satisfaction.     Antonio Smith

## 2017-10-28 NOTE — Discharge Instructions (Signed)
[facility] [phone]   POST OP INSTRUCTIONS  Always review your discharge instruction sheet given to you by the facility where your surgery was performed. IF YOU HAVE DISABILITY OR FAMILY LEAVE FORMS, YOU MUST BRING THEM TO THE OFFICE FOR PROCESSING.   DO NOT GIVE THEM TO YOUR DOCTOR. A prescription for pain medication may be given to you upon discharge.  Take your pain medication as prescribed, if needed.  If narcotic pain medicine is not needed, then you may take acetaminophen (Tylenol) or ibuprofen (Advil) as needed. 1. Take your usually prescribed medications unless otherwise directed. 2. If you need a refill on your pain medication, please contact your pharmacy.  They will contact our office to request authorization.  Prescriptions will not be filled after 5pm or on week-ends. 3. You should follow a light diet the first few days after arrival home, such as soup and crackers, etc.  Resume your normal diet the day after surgery. 4. Most patients will experience some swelling and bruising on the chest.  Ice packs will help.  Swelling and bruising can take several days to resolve.  5. It is common to experience some constipation if taking pain medication after surgery.  Increasing fluid intake and taking a stool softener (such as Colace) will usually help or prevent this problem from occurring.  A mild laxative (Milk of Magnesia or Miralax) should be taken according to package instructions if there are no bowel movements after 48 hours. 6.  Remove the outer dressing in two days. You have steri-strips (small skin tapes) in place directly over the incision.  These strips should be left on the skin for 7-10 days.  7. ACTIVITIES:  You may resume regular (light) daily activities beginning the next day--such as daily self-care, walking, climbing stairs--gradually increasing activities as tolerated.  You may have sexual intercourse when it is comfortable.  Refrain from any heavy  lifting or straining until approved by your doctor. a. You may drive when you are no longer taking prescription pain medication, you can comfortably wear a seatbelt, and you can safely maneuver your car and apply brakes. b. RETURN TO WORK:  __________________________________________________________ 8. You should see your doctor in the office for a follow-up appointment approximately 2-3 weeks after your surgery.  Your doctors nurse will typically make your follow-up appointment when she calls you with your pathology report.  Expect your pathology report 2-3 business days after your surgery.  You may call to check if you do not hear from [place] after three days.   9. OTHER INSTRUCTIONS: ______________________________________________________________________________________________ ____________________________________________________________________________________________ WHEN TO CALL YOUR DOCTOR: 1. Fever over 101.0 2. Nausea and/or vomiting 3. Extreme swelling or bruising 4. Continued bleeding from incision. 5. Increased pain, redness, or drainage from the incision. The clinic staff is available to answer your questions during regular business hours.  Please dont hesitate to call and ask to speak to one of the nurses for clinical concerns.  If you have a medical emergency, go to the nearest emergency room or call 911.  A surgeon from [facility] is always on call at the hospital. [address] [place], [place], [place]  [address] ? [address], [place], [place]   [address] [phone] ? [phone] ? FAX [phone] Web site: [email or web address]

## 2017-10-28 NOTE — Op Note (Signed)
Preop diagnosis: Left nipple discharge and palpable mass Postop diagnosis: Same Procedure performed: Left subcutaneous mastectomy Surgeon:Amelia Macken K Tad Fancher Anesthesia: General Indications: This is a 42 year old male who presents with 2 months of some discharge from his left nipple.  Occasionally this is become bloody.  He has a palpable mass behind his area Ola.  Mammogram and ultrasound showed some benign breast tissue with inflammation behind the nipple.  There is also a reactive lymph node located at 2:00.  She presents now for excision of this area for definitive diagnosis.  Description of procedure: The patient is brought to the operating room and placed in the supine position on the operating room table.  After an adequate level of general anesthesia was obtained, his left chest was prepped with ChloraPrep and draped in sterile fashion.  A timeout was taken to ensure the proper patient and proper procedure.  I outlined the edges of the palpable breast tissue with a skin marker.  We made a transverse incision below the nipple.  We dissected inferiorly until we encountered the inframammary crease.  I then dissected just below the dermis across the anterior surface of all the breast tissue.  We then began at the inframammary crease and dissected the breast tissue off of the underlying muscle.  The subcutaneous mastectomy specimen was removed and oriented with the pink kit.  A specimen mammogram showed that we had the ribbon marker in the retroareolar region.  However the coil marker at 2:00 was not within the specimen.  We excised an additional superior lateral margin.  The specimen mammogram showed the coil marker.  We inspected the wound for hemostasis.  We irrigated the wound thoroughly.  The wound was closed with a deep layer of 3-0 Vicryl and a subcuticular layer 4-0 Monocryl.  Benzoin Steri-Strips were applied.  The patient was then extubated and brought to recovery in stable condition.  All sponge,  instrument, and needle counts are correct.  Imogene Burn. Georgette Dover, MD, [facility]  General/ Trauma Surgery Beeper [phone]  10/28/2017 11:10 AM

## 2017-10-28 NOTE — Anesthesia Procedure Notes (Signed)
Procedure Name: LMA Insertion Date/Time: 10/28/2017 10:00 AM Performed by: Lavell Luster, CRNA Pre-anesthesia Checklist: Patient identified, Emergency Drugs available, Suction available, Patient being monitored and Timeout performed Patient Re-evaluated:Patient Re-evaluated prior to induction Oxygen Delivery Method: Circle system utilized Preoxygenation: Pre-oxygenation with 100% oxygen Induction Type: IV induction Ventilation: Mask ventilation without difficulty LMA: LMA inserted LMA Size: 5.0 Number of attempts: 1 Placement Confirmation: positive ETCO2 and breath sounds checked- equal and bilateral Tube secured with: Tape Dental Injury: Teeth and Oropharynx as per pre-operative assessment

## 2017-10-29 ENCOUNTER — Encounter (HOSPITAL_COMMUNITY): Payer: Self-pay | Admitting: Surgery

## 2017-10-30 NOTE — Anesthesia Postprocedure Evaluation (Signed)
Anesthesia Post Note  Patient: Antonio Smith  Procedure(s) Performed: EXCISION LEFT BREAST MASS (Left Breast)     Patient location during evaluation: PACU Anesthesia Type: General Level of consciousness: awake and alert Pain management: pain level controlled Vital Signs Assessment: post-procedure vital signs reviewed and stable Respiratory status: spontaneous breathing, nonlabored ventilation, respiratory function stable and patient connected to nasal cannula oxygen Cardiovascular status: blood pressure returned to baseline and stable Postop Assessment: no apparent nausea or vomiting Anesthetic complications: no    Last Vitals:  Vitals:   10/28/17 1200 10/28/17 1226  BP: 113/89 (!) 119/96  Pulse: 80 72  Resp: 15   Temp:    SpO2: 97% 93%    Last Pain:  Vitals:   10/28/17 1156  TempSrc:   PainSc: [place]

## 2018-03-19 DIAGNOSIS — H66002 Acute suppurative otitis media without spontaneous rupture of ear drum, left ear: Secondary | ICD-10-CM | POA: Diagnosis not present

## 2018-03-19 DIAGNOSIS — R05 Cough: Secondary | ICD-10-CM | POA: Diagnosis not present

## 2018-04-05 DIAGNOSIS — J029 Acute pharyngitis, unspecified: Secondary | ICD-10-CM | POA: Diagnosis not present

## 2018-04-05 DIAGNOSIS — B370 Candidal stomatitis: Secondary | ICD-10-CM | POA: Diagnosis not present

## 2018-04-05 DIAGNOSIS — J0110 Acute frontal sinusitis, unspecified: Secondary | ICD-10-CM | POA: Diagnosis not present

## 2018-05-06 DIAGNOSIS — K080 Exfoliation of teeth due to systemic causes: Secondary | ICD-10-CM | POA: Diagnosis not present

## 2018-05-07 ENCOUNTER — Other Ambulatory Visit: Payer: Self-pay | Admitting: Family Medicine

## 2018-05-07 ENCOUNTER — Other Ambulatory Visit: Payer: Self-pay

## 2018-05-07 ENCOUNTER — Ambulatory Visit
Admission: RE | Admit: 2018-05-07 | Discharge: 2018-05-07 | Disposition: A | Discharge status: Home / Self Care | Payer: Federal, State, Local not specified - PPO | Source: Ambulatory Visit | Attending: Family Medicine | Admitting: Family Medicine

## 2018-05-07 DIAGNOSIS — R928 Other abnormal and inconclusive findings on diagnostic imaging of breast: Secondary | ICD-10-CM | POA: Diagnosis not present

## 2018-05-07 DIAGNOSIS — N6320 Unspecified lump in the left breast, unspecified quadrant: Secondary | ICD-10-CM

## 2018-05-07 DIAGNOSIS — N6489 Other specified disorders of breast: Secondary | ICD-10-CM | POA: Diagnosis not present

## 2018-05-07 DIAGNOSIS — N63 Unspecified lump in unspecified breast: Secondary | ICD-10-CM | POA: Diagnosis not present

## 2018-05-10 DIAGNOSIS — N6320 Unspecified lump in the left breast, unspecified quadrant: Secondary | ICD-10-CM | POA: Diagnosis not present

## 2018-05-10 DIAGNOSIS — N6489 Other specified disorders of breast: Secondary | ICD-10-CM | POA: Diagnosis not present

## 2018-10-26 DIAGNOSIS — C50919 Malignant neoplasm of unspecified site of unspecified female breast: Secondary | ICD-10-CM

## 2018-10-26 HISTORY — DX: Malignant neoplasm of unspecified site of unspecified female breast: C50.919

## 2018-10-26 HISTORY — PX: BREAST EXCISIONAL BIOPSY: SUR124

## 2018-12-03 DIAGNOSIS — N6452 Nipple discharge: Secondary | ICD-10-CM | POA: Diagnosis not present

## 2019-02-15 DIAGNOSIS — Z20828 Contact with and (suspected) exposure to other viral communicable diseases: Secondary | ICD-10-CM | POA: Diagnosis not present

## 2019-03-28 DIAGNOSIS — I2699 Other pulmonary embolism without acute cor pulmonale: Secondary | ICD-10-CM

## 2019-03-28 HISTORY — DX: Other pulmonary embolism without acute cor pulmonale: I26.99

## 2019-05-24 DIAGNOSIS — G40909 Epilepsy, unspecified, not intractable, without status epilepticus: Secondary | ICD-10-CM | POA: Diagnosis not present

## 2019-05-24 DIAGNOSIS — E7800 Pure hypercholesterolemia, unspecified: Secondary | ICD-10-CM | POA: Diagnosis not present

## 2019-06-01 DIAGNOSIS — E7800 Pure hypercholesterolemia, unspecified: Secondary | ICD-10-CM | POA: Diagnosis not present

## 2019-06-01 DIAGNOSIS — G40909 Epilepsy, unspecified, not intractable, without status epilepticus: Secondary | ICD-10-CM | POA: Diagnosis not present

## 2019-06-01 DIAGNOSIS — Z125 Encounter for screening for malignant neoplasm of prostate: Secondary | ICD-10-CM | POA: Diagnosis not present

## 2019-06-01 DIAGNOSIS — Z131 Encounter for screening for diabetes mellitus: Secondary | ICD-10-CM | POA: Diagnosis not present

## 2019-07-01 DIAGNOSIS — R7309 Other abnormal glucose: Secondary | ICD-10-CM | POA: Diagnosis not present

## 2019-10-24 IMAGING — MG DIGITAL DIAGNOSTIC UNILATERAL LEFT MAMMOGRAM WITH TOMO AND CAD
6 series · 6 of 18 positions shown · non-contrast
Comparison: Previous exam(s).

ACR Breast Density Category a: The breast tissue is almost entirely
fatty.

CLINICAL DATA: Previous surgery for bloody nipple discharge on the
left. Patient presents with new clear blood tinged discharge.

EXAM:
DIGITAL DIAGNOSTIC LEFT MAMMOGRAM WITH CAD AND TOMO
ULTRASOUND LEFT BREAST

[L CC synth-2D (1 of 2)]
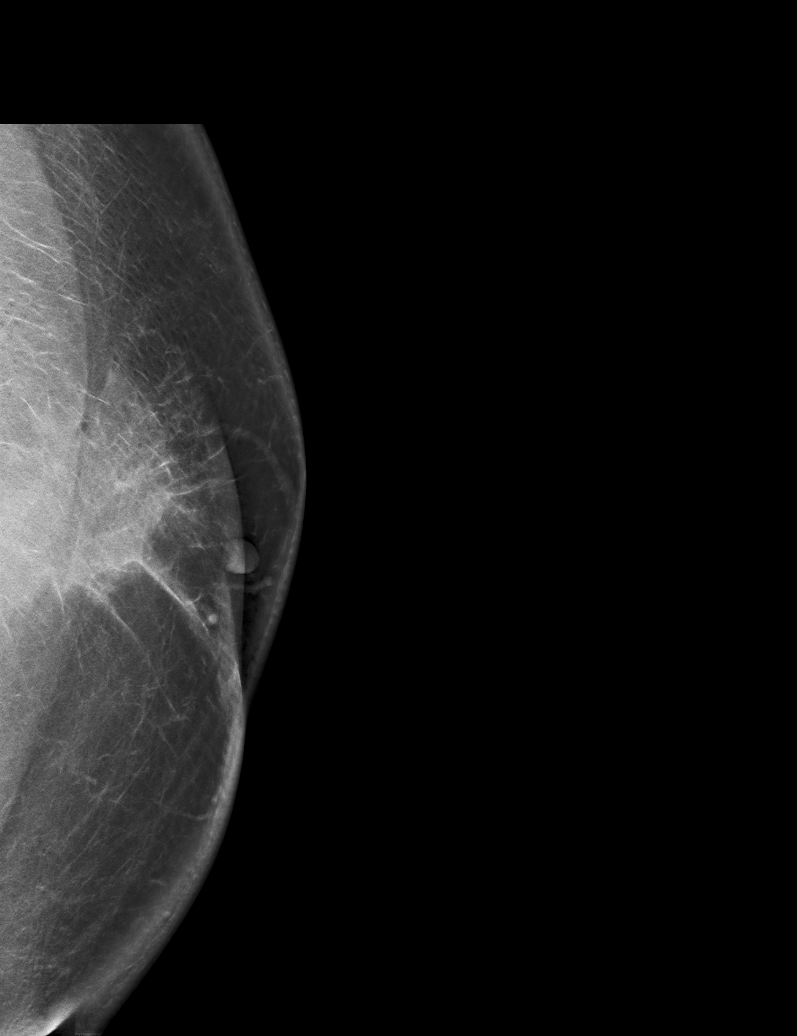

[L MLO synth-2D]
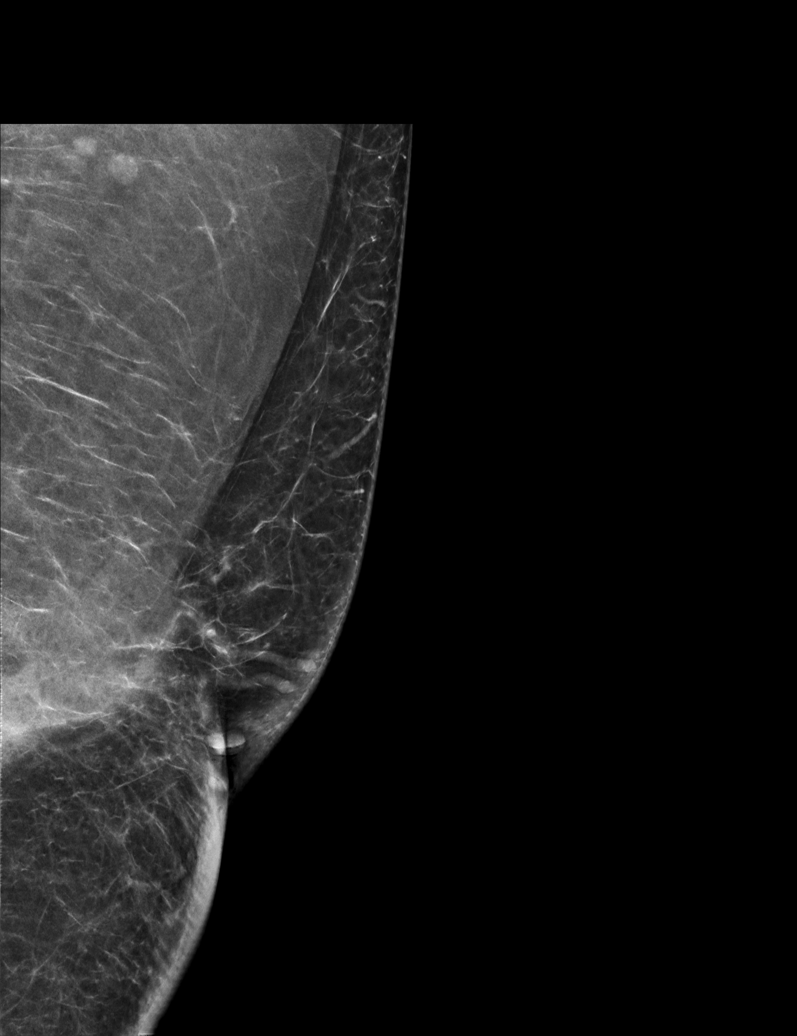

[L CC synth-2D (2 of 2)]
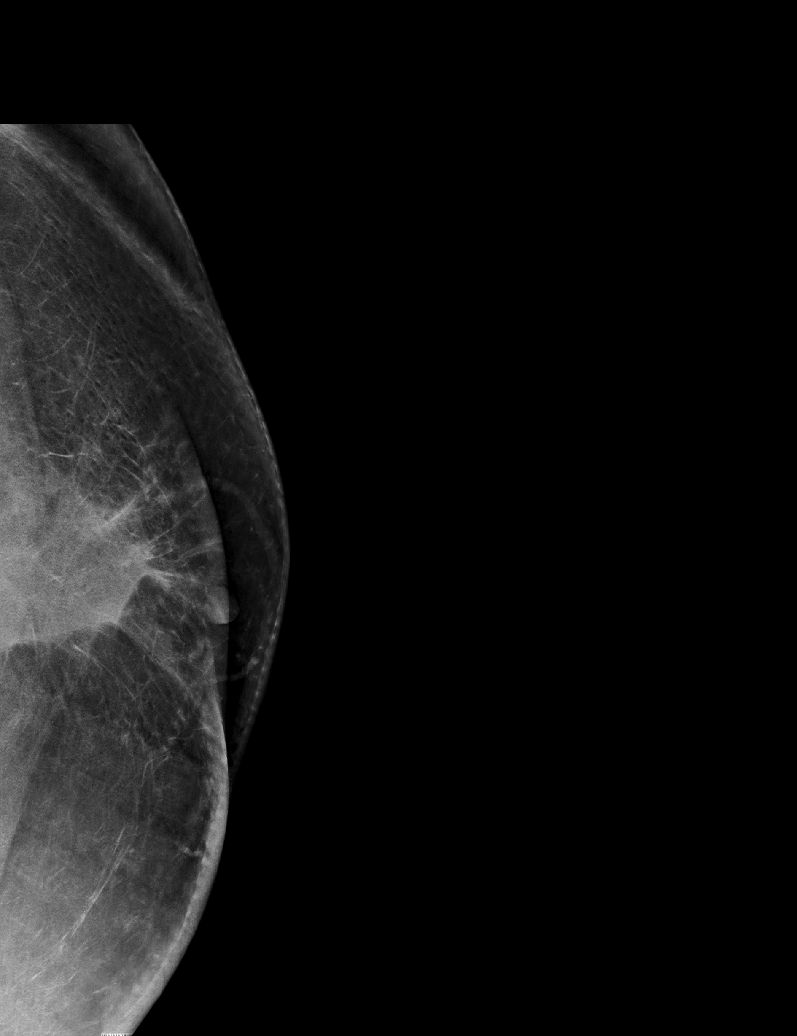

[L MLO tomo · tomo slice 41/82.0]
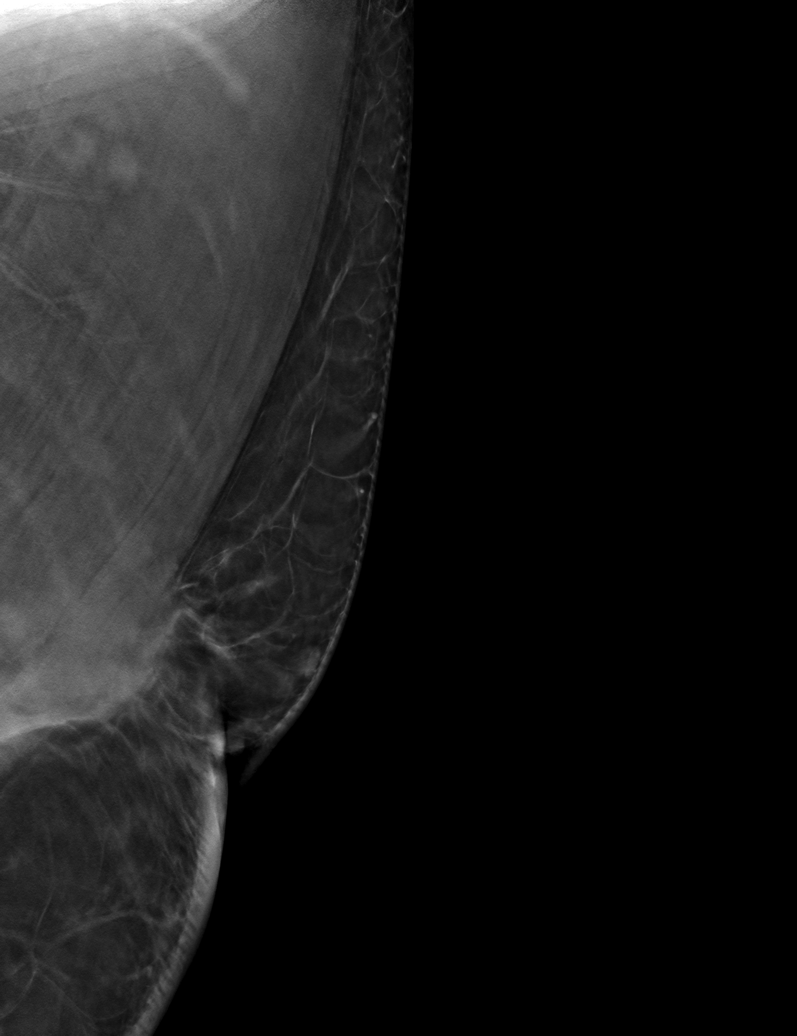

[L CC tomo (1 of 2) · tomo slice 47/94.0]
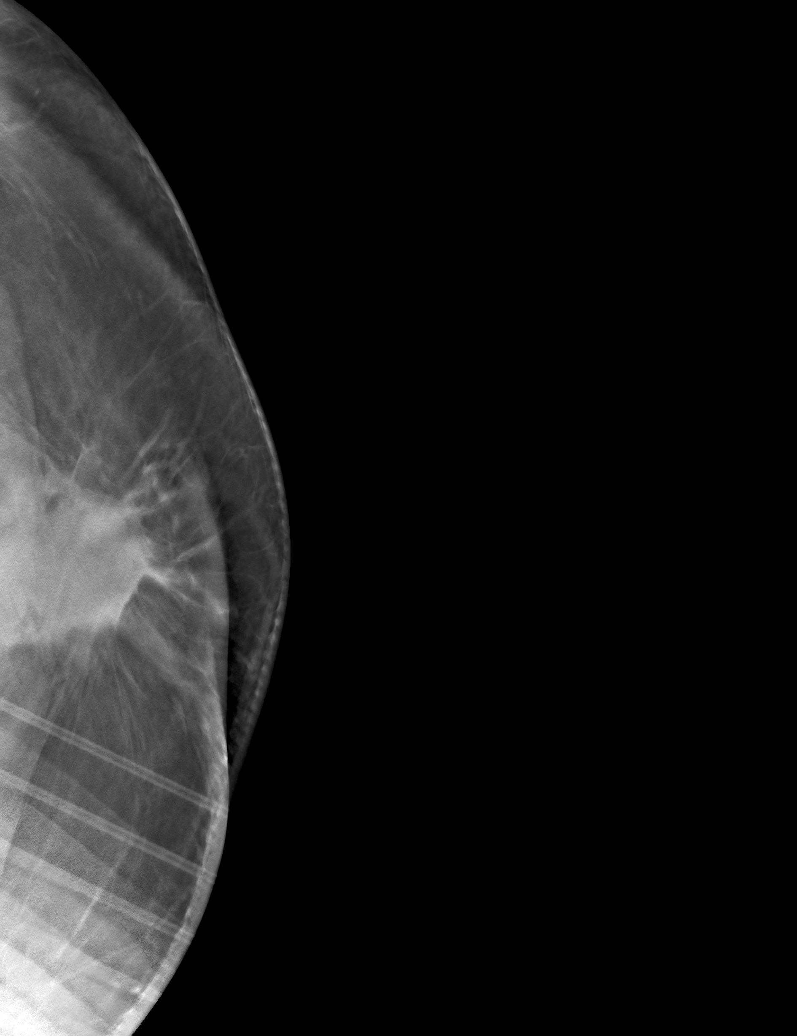

[L CC tomo (2 of 2) · tomo slice 53/105.0]
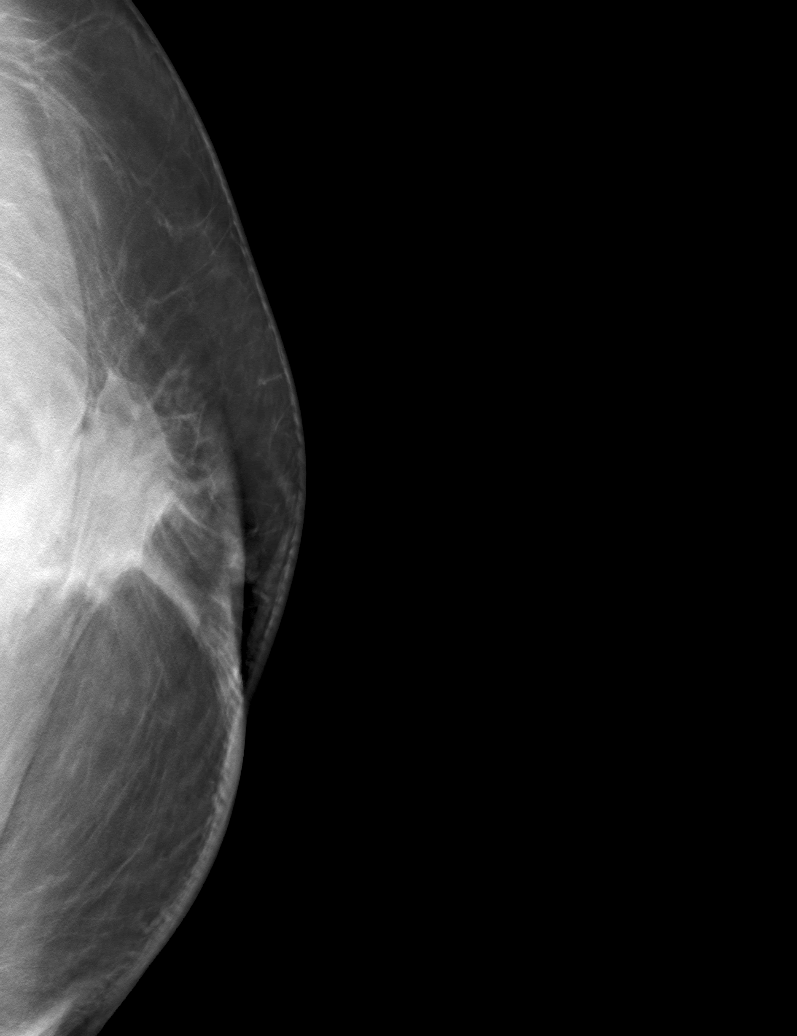

[6 of 18 positions shown; findings below may reference images not displayed]

FINDINGS: There is a new rounded density in the region of the patient's
previous surgery behind the nipple. No other suspicious mammographic
findings.

Mammographic images were processed with CAD.

On physical exam, no suspicious lumps are identified.

Targeted ultrasound is performed, showing a seroma posterior to the
nipple. No evidence of malignancy.
IMPRESSION: There is a postoperative seroma accounting for the lumpiness in the
left breast. The cause for the new nipple discharge is not
definitively seen. It is theoretically possible that the seroma
could connect to the ductal system resulting in the discharge.

RECOMMENDATION:
Recommend follow-up with the patient's surgeon. No further imaging
follow-up necessary.

I have discussed the findings and recommendations with the patient.
Results were also provided in writing at the conclusion of the
visit. If applicable, a reminder letter will be sent to the patient
regarding the next appointment.

BI-RADS CATEGORY  2: Benign.

## 2020-04-09 DIAGNOSIS — H33002 Unspecified retinal detachment with retinal break, left eye: Secondary | ICD-10-CM | POA: Diagnosis not present

## 2020-04-09 DIAGNOSIS — H25812 Combined forms of age-related cataract, left eye: Secondary | ICD-10-CM | POA: Diagnosis not present

## 2020-05-25 DIAGNOSIS — Z0000 Encounter for general adult medical examination without abnormal findings: Secondary | ICD-10-CM | POA: Diagnosis not present

## 2020-05-25 DIAGNOSIS — R7309 Other abnormal glucose: Secondary | ICD-10-CM | POA: Diagnosis not present

## 2020-05-25 DIAGNOSIS — E7800 Pure hypercholesterolemia, unspecified: Secondary | ICD-10-CM | POA: Diagnosis not present

## 2020-05-25 DIAGNOSIS — Z125 Encounter for screening for malignant neoplasm of prostate: Secondary | ICD-10-CM | POA: Diagnosis not present

## 2020-06-01 DIAGNOSIS — H25812 Combined forms of age-related cataract, left eye: Secondary | ICD-10-CM | POA: Diagnosis not present

## 2020-09-12 ENCOUNTER — Ambulatory Visit: Payer: Federal, State, Local not specified - PPO | Admitting: Neurology

## 2020-09-12 ENCOUNTER — Other Ambulatory Visit: Payer: Self-pay

## 2020-09-12 ENCOUNTER — Encounter: Payer: Self-pay | Admitting: Neurology

## 2020-09-12 VITALS — BP 134/86 | HR 89 | Ht 70.0 in | Wt 250.8 lb

## 2020-09-12 DIAGNOSIS — S069X9S Unspecified intracranial injury with loss of consciousness of unspecified duration, sequela: Secondary | ICD-10-CM

## 2020-09-12 DIAGNOSIS — S069XAA Unspecified intracranial injury with loss of consciousness status unknown, initial encounter: Secondary | ICD-10-CM | POA: Insufficient documentation

## 2020-09-12 DIAGNOSIS — G40219 Localization-related (focal) (partial) symptomatic epilepsy and epileptic syndromes with complex partial seizures, intractable, without status epilepticus: Secondary | ICD-10-CM | POA: Diagnosis not present

## 2020-09-12 DIAGNOSIS — S069X9A Unspecified intracranial injury with loss of consciousness of unspecified duration, initial encounter: Secondary | ICD-10-CM | POA: Insufficient documentation

## 2020-09-12 MED ORDER — LEVETIRACETAM 250 MG PO TABS: 500.0000 mg | ORAL_TABLET | Freq: Two times a day (BID) | ORAL | 5 refills | Status: DC

## 2020-09-12 NOTE — Patient Instructions (Signed)
We will start Keppra 250 mg tablets:  take One tablet twice a day for 2 weeks, then take 2 tablets twice a day
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
## 2020-09-17 ENCOUNTER — Ambulatory Visit: Payer: Federal, State, Local not specified - PPO | Admitting: Neurology

## 2021-01-21 ENCOUNTER — Ambulatory Visit: Payer: Federal, State, Local not specified - PPO | Admitting: Family Medicine

## 2021-01-21 ENCOUNTER — Encounter: Payer: Self-pay | Admitting: Family Medicine

## 2021-01-21 VITALS — BP 136/83 | HR 95 | Ht 70.0 in | Wt 250.0 lb

## 2021-01-21 DIAGNOSIS — G40219 Localization-related (focal) (partial) symptomatic epilepsy and epileptic syndromes with complex partial seizures, intractable, without status epilepticus: Secondary | ICD-10-CM | POA: Diagnosis not present

## 2021-01-21 DIAGNOSIS — S069X9S Unspecified intracranial injury with loss of consciousness of unspecified duration, sequela: Secondary | ICD-10-CM

## 2021-01-21 MED ORDER — LEVETIRACETAM ER 500 MG PO TB24: 1000.0000 mg | ORAL_TABLET | Freq: Every day | ORAL | 1 refills | Status: DC

## 2021-01-21 NOTE — Patient Instructions (Addendum)
Below is our plan:  We will check labs. We are going to change your dose of levetiracetam to an extended release version. I want you to take two tablets (1000mg ) daily of levetiracetam XR. Please do not drive until you are seizure free for 6 months.   Please make sure you are staying well hydrated. I recommend 50-60 ounces daily. Well balanced diet and regular exercise encouraged. Consistent sleep schedule with 6-8 hours recommended.   Please continue follow up with care team as directed.   Follow up with Dr April Manson 2-3 months.   You may receive a survey regarding today's visit. I encourage you to leave honest feed back as I do use this information to improve patient care. Thank you for seeing me today!   According to [place] law, you can not drive unless you are seizure / syncope free for at least 6 months and under physician's care.  Please maintain precautions. Do not participate in activities where a loss of awareness could harm you or someone else. No swimming alone, no tub bathing, no hot tubs, no driving, no operating motorized vehicles (cars, ATVs, motocycles, etc), lawnmowers, power tools or firearms. No standing at heights, such as rooftops, ladders or stairs. Avoid hot objects such as stoves, heaters, open fires. Wear a helmet when riding a bicycle, scooter, skateboard, etc. and avoid areas of traffic. Set your water heater to 120 degrees or less.
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
## 2021-01-24 LAB — COMPREHENSIVE METABOLIC PANEL
ALT: 24 IU/L (ref 0–44)
AST: 18 IU/L (ref 0–40)
Albumin/Globulin Ratio: 2.1 (ref 1.2–2.2)
Albumin: 4.5 g/dL (ref 4.0–5.0)
Alkaline Phosphatase: 98 IU/L (ref 44–121)
BUN/Creatinine Ratio: 15 (ref 9–20)
BUN: 14 mg/dL (ref 6–24)
Bilirubin Total: 0.3 mg/dL (ref 0.0–1.2)
CO2: 23 mmol/L (ref 20–29)
Calcium: 9.8 mg/dL (ref 8.7–10.2)
Chloride: 106 mmol/L (ref 96–106)
Creatinine, Ser: 0.96 mg/dL (ref 0.76–1.27)
Globulin, Total: 2.1 g/dL (ref 1.5–4.5)
Glucose: 163 mg/dL — ABNORMAL HIGH (ref 70–99)
Potassium: 4.2 mmol/L (ref 3.5–5.2)
Sodium: 142 mmol/L (ref 134–144)
Total Protein: 6.6 g/dL (ref 6.0–8.5)
eGFR: 99 mL/min/{1.73_m2} (ref >59)

## 2021-01-24 LAB — LEVETIRACETAM LEVEL: Levetiracetam Lvl: <2 ug/mL — ABNORMAL LOW (ref 10.0–40.0)

## 2021-02-10 DIAGNOSIS — M79605 Pain in left leg: Secondary | ICD-10-CM | POA: Diagnosis not present

## 2021-02-10 DIAGNOSIS — U071 COVID-19: Secondary | ICD-10-CM | POA: Diagnosis not present

## 2021-02-10 DIAGNOSIS — R918 Other nonspecific abnormal finding of lung field: Secondary | ICD-10-CM | POA: Diagnosis not present

## 2021-02-10 DIAGNOSIS — M79602 Pain in left arm: Secondary | ICD-10-CM | POA: Diagnosis not present

## 2021-02-10 DIAGNOSIS — Z8669 Personal history of other diseases of the nervous system and sense organs: Secondary | ICD-10-CM | POA: Diagnosis not present

## 2021-02-10 DIAGNOSIS — Z888 Allergy status to other drugs, medicaments and biological substances status: Secondary | ICD-10-CM | POA: Diagnosis not present

## 2021-02-10 DIAGNOSIS — R5383 Other fatigue: Secondary | ICD-10-CM | POA: Diagnosis not present

## 2021-02-10 DIAGNOSIS — R079 Chest pain, unspecified: Secondary | ICD-10-CM | POA: Diagnosis not present

## 2021-02-10 DIAGNOSIS — F17210 Nicotine dependence, cigarettes, uncomplicated: Secondary | ICD-10-CM | POA: Diagnosis not present

## 2021-02-10 DIAGNOSIS — Z791 Long term (current) use of non-steroidal anti-inflammatories (NSAID): Secondary | ICD-10-CM | POA: Diagnosis not present

## 2021-02-10 DIAGNOSIS — R0789 Other chest pain: Secondary | ICD-10-CM | POA: Diagnosis not present

## 2021-02-10 DIAGNOSIS — Z79899 Other long term (current) drug therapy: Secondary | ICD-10-CM | POA: Diagnosis not present

## 2021-03-26 ENCOUNTER — Ambulatory Visit: Payer: Federal, State, Local not specified - PPO | Admitting: Neurology

## 2021-03-26 ENCOUNTER — Encounter: Payer: Self-pay | Admitting: Neurology

## 2021-03-26 VITALS — BP 111/74 | HR 106 | Ht 70.0 in | Wt 251.4 lb

## 2021-03-26 DIAGNOSIS — S069X9S Unspecified intracranial injury with loss of consciousness of unspecified duration, sequela: Secondary | ICD-10-CM

## 2021-03-26 DIAGNOSIS — Z5181 Encounter for therapeutic drug level monitoring: Secondary | ICD-10-CM | POA: Diagnosis not present

## 2021-03-26 DIAGNOSIS — G40219 Localization-related (focal) (partial) symptomatic epilepsy and epileptic syndromes with complex partial seizures, intractable, without status epilepticus: Secondary | ICD-10-CM

## 2021-03-26 MED ORDER — LEVETIRACETAM ER 1000 MG PO TB24: 1000.0000 mg | ORAL_TABLET | Freq: Every day | ORAL | 11 refills | Status: DC
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
## 2021-03-26 NOTE — Patient Instructions (Addendum)
Switch to Levetiracetam 1000 mg XR daily  Continue your other medications  Routine EEG  We will check a levetiracetam level today  Return in 3 months or sooner if worse    Per [facility] statutes, patients with seizures are not allowed to drive until they have been seizure-free for six months.  Other recommendations include using caution when using heavy equipment or power tools. Avoid working on ladders or at heights. Take showers instead of baths.  Do not swim alone.  Ensure the water temperature is not too high on the home water heater. Do not go swimming alone. Do not lock yourself in a room alone (i.e. bathroom). When caring for infants or small children, sit down when holding, feeding, or changing them to minimize risk of injury to the child in the event you have a seizure. Maintain good sleep hygiene. Avoid alcohol.  Also recommend adequate sleep, hydration, good diet and minimize stress.   During the Seizure  - First, ensure adequate ventilation and place patients on the floor on their left side  Loosen clothing around the neck and ensure the airway is patent. If the patient is clenching the teeth, do not force the mouth open with any object as this can cause severe damage - Remove all items from the surrounding that can be hazardous. The patient may be oblivious to what's happening and may not even know what he or she is doing. If the patient is confused and wandering, either gently guide him/her away and block access to outside areas - Reassure the individual and be comforting - Call 911. In most cases, the seizure ends before EMS arrives. However, there are cases when seizures may last over 3 to 5 minutes. Or the individual may have developed breathing difficulties or severe injuries. If a pregnant patient or a person with diabetes develops a seizure, it is prudent to call an ambulance. - Finally, if the patient does not regain full consciousness, then call EMS. Most patients  will remain confused for about 45 to 90 minutes after a seizure, so you must use judgment in calling for help. - Avoid restraints but make sure the patient is in a bed with padded side rails - Place the individual in a lateral position with the neck slightly flexed; this will help the saliva drain from the mouth and prevent the tongue from falling backward - Remove all nearby furniture and other hazards from the area - Provide verbal assurance as the individual is regaining consciousness - Provide the patient with privacy if possible - Call for help and start treatment as ordered by the caregiver   After the Seizure (Postictal Stage)  After a seizure, most patients experience confusion, fatigue, muscle pain and/or a headache. Thus, one should permit the individual to sleep. For the next few days, reassurance is essential. Being calm and helping reorient the person is also of importance.  Most seizures are painless and end spontaneously. Seizures are not harmful to others but can lead to complications such as stress on the lungs, brain and the heart. Individuals with prior lung problems may develop labored breathing and respiratory distress.

## 2021-03-27 LAB — LEVETIRACETAM LEVEL: Levetiracetam Lvl: 7.8 ug/mL — ABNORMAL LOW (ref 10.0–40.0)

## 2021-03-28 ENCOUNTER — Other Ambulatory Visit: Payer: Self-pay | Admitting: Neurology

## 2021-03-28 MED ORDER — LEVETIRACETAM ER 500 MG PO TB24: 500.0000 mg | ORAL_TABLET | Freq: Every day | ORAL | 4 refills | Status: DC

## 2021-04-01 ENCOUNTER — Ambulatory Visit: Payer: Federal, State, Local not specified - PPO | Admitting: Neurology

## 2021-04-01 ENCOUNTER — Other Ambulatory Visit: Payer: Self-pay

## 2021-04-01 DIAGNOSIS — S069X9S Unspecified intracranial injury with loss of consciousness of unspecified duration, sequela: Secondary | ICD-10-CM

## 2021-04-01 DIAGNOSIS — G40219 Localization-related (focal) (partial) symptomatic epilepsy and epileptic syndromes with complex partial seizures, intractable, without status epilepticus: Secondary | ICD-10-CM

## 2021-04-01 NOTE — Procedures (Signed)
° ° °  History:  26 yom with history of TBI and seizures   EEG classification:  Awake and asleep  Description of the recording: The background rhythms of this recording consists of a fairly well modulated medium amplitude background activity of 10-11 Hz. As the record progresses, the patient initially is in the waking state, but appears to enter the early stage II sleep during the recording, with rudimentary sleep spindles and vertex sharp wave activity seen. During the wakeful state, photic stimulation is performed, and no abnormal responses were seen. Hyperventilation was also performed, no abnormal response seen. No epileptiform discharges seen during this recording. There was no focal slowing. EKG monitor shows no evidence of cardiac rhythm abnormalities with a heart rate of 66.  Impression: This is a normal EEG recording in the waking and sleeping state. No evidence interictal epileptiform discharges were seen at any time during the recording.  A normal EEG does not exclude a diagnosis of epilepsy.    Alric Ran, MD [facility]

## 2021-06-10 ENCOUNTER — Other Ambulatory Visit: Payer: Self-pay | Admitting: Neurology

## 2021-06-10 ENCOUNTER — Other Ambulatory Visit: Payer: Self-pay | Admitting: Hematology and Oncology

## 2021-06-12 DIAGNOSIS — R0602 Shortness of breath: Secondary | ICD-10-CM | POA: Diagnosis not present

## 2021-06-12 DIAGNOSIS — G40909 Epilepsy, unspecified, not intractable, without status epilepticus: Secondary | ICD-10-CM | POA: Diagnosis not present

## 2021-06-12 DIAGNOSIS — Z79899 Other long term (current) drug therapy: Secondary | ICD-10-CM | POA: Diagnosis not present

## 2021-06-12 DIAGNOSIS — E7800 Pure hypercholesterolemia, unspecified: Secondary | ICD-10-CM | POA: Diagnosis not present

## 2021-06-12 DIAGNOSIS — Z0000 Encounter for general adult medical examination without abnormal findings: Secondary | ICD-10-CM | POA: Diagnosis not present

## 2021-06-12 DIAGNOSIS — Z125 Encounter for screening for malignant neoplasm of prostate: Secondary | ICD-10-CM | POA: Diagnosis not present

## 2021-06-24 ENCOUNTER — Ambulatory Visit: Payer: Federal, State, Local not specified - PPO | Admitting: Neurology

## 2021-07-04 ENCOUNTER — Encounter: Payer: Self-pay | Admitting: Neurology

## 2021-07-04 ENCOUNTER — Ambulatory Visit: Payer: Federal, State, Local not specified - PPO | Admitting: Neurology

## 2021-07-08 ENCOUNTER — Encounter: Payer: Self-pay | Admitting: Neurology

## 2021-07-18 ENCOUNTER — Other Ambulatory Visit: Payer: Self-pay | Admitting: Family Medicine

## 2021-07-18 ENCOUNTER — Ambulatory Visit
Admission: RE | Admit: 2021-07-18 | Discharge: 2021-07-18 | Disposition: A | Discharge status: Home / Self Care | Payer: Federal, State, Local not specified - PPO | Source: Ambulatory Visit | Attending: Family Medicine | Admitting: Family Medicine

## 2021-07-18 DIAGNOSIS — R0602 Shortness of breath: Secondary | ICD-10-CM | POA: Diagnosis not present

## 2021-10-01 DIAGNOSIS — J069 Acute upper respiratory infection, unspecified: Secondary | ICD-10-CM | POA: Diagnosis not present

## 2021-10-21 ENCOUNTER — Telehealth: Payer: Self-pay | Admitting: Neurology

## 2021-10-21 NOTE — Telephone Encounter (Signed)
Please call and advise patient to increase Keppra to 1500 mg (3 tablets) once a day. It is XR, so he can take it daily.   Thank you  Dr. April Manson

## 2021-10-21 NOTE — Telephone Encounter (Signed)
Pt having more seizures.last seizure on Saturday,10/19/21; was having dinner; wife said started switching, wife was calling my name when I came out of it. Have scheduled on 10/31/21 at 9:15 am

## 2021-10-21 NOTE — Telephone Encounter (Signed)
I returned pt's call. He reports on 10/19/2021 he had another seizure episode. Sts he was at home started twitching and shaking, lasted 5 minutes and then returned to base line.   Previous episode 10/04/2021. Pt confirmed Keppra dosage as 500 XR twice daily and he has been consistently taking the med. He feels like sleep/job stress are factors for these episodes.   He has scheduled a follow up for 10/31/2021. Will update MD on this.

## 2021-10-22 DIAGNOSIS — R052 Subacute cough: Secondary | ICD-10-CM | POA: Diagnosis not present

## 2021-10-22 DIAGNOSIS — R0602 Shortness of breath: Secondary | ICD-10-CM | POA: Diagnosis not present

## 2021-10-22 DIAGNOSIS — R042 Hemoptysis: Secondary | ICD-10-CM | POA: Diagnosis not present

## 2021-10-22 DIAGNOSIS — Z86718 Personal history of other venous thrombosis and embolism: Secondary | ICD-10-CM | POA: Diagnosis not present

## 2021-10-22 NOTE — Telephone Encounter (Signed)
I called the pt and relayed new instructions. Pt verbalized understanding and agreement to plan. I have updated med list.

## 2021-10-31 ENCOUNTER — Encounter: Payer: Self-pay | Admitting: Neurology

## 2021-10-31 ENCOUNTER — Ambulatory Visit: Payer: Self-pay | Admitting: Neurology

## 2021-10-31 VITALS — BP 140/98 | HR 90 | Ht 70.0 in | Wt 254.0 lb

## 2021-10-31 DIAGNOSIS — R0689 Other abnormalities of breathing: Secondary | ICD-10-CM

## 2021-10-31 DIAGNOSIS — R059 Cough, unspecified: Secondary | ICD-10-CM

## 2021-10-31 NOTE — Progress Notes (Signed)
Patient with moderate difficulty breathing, loud wheezes, and cough. I am concerned about his breathing and directed him to urgent care/ED. He can't complete a full sentence without stopping to take a deep breath or coughing. I will see him next week.    Dr. April Manson

## 2021-11-01 DIAGNOSIS — R059 Cough, unspecified: Secondary | ICD-10-CM | POA: Diagnosis not present

## 2021-11-01 DIAGNOSIS — J209 Acute bronchitis, unspecified: Secondary | ICD-10-CM | POA: Diagnosis not present

## 2021-11-05 ENCOUNTER — Encounter: Payer: Self-pay | Admitting: Neurology

## 2021-11-05 ENCOUNTER — Ambulatory Visit (INDEPENDENT_AMBULATORY_CARE_PROVIDER_SITE_OTHER): Payer: Federal, State, Local not specified - PPO | Admitting: Neurology

## 2021-11-05 VITALS — BP 129/89 | HR 81 | Ht 70.0 in | Wt 259.0 lb

## 2021-11-05 DIAGNOSIS — G40219 Localization-related (focal) (partial) symptomatic epilepsy and epileptic syndromes with complex partial seizures, intractable, without status epilepticus: Secondary | ICD-10-CM | POA: Diagnosis not present

## 2021-11-05 DIAGNOSIS — S069X9S Unspecified intracranial injury with loss of consciousness of unspecified duration, sequela: Secondary | ICD-10-CM

## 2021-11-05 MED ORDER — LEVETIRACETAM ER 500 MG PO TB24: 1000.0000 mg | ORAL_TABLET | Freq: Two times a day (BID) | ORAL | 2 refills | Status: DC
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
## 2021-11-08 ENCOUNTER — Encounter: Payer: Self-pay | Admitting: Neurology

## 2021-11-14 ENCOUNTER — Other Ambulatory Visit: Payer: Self-pay | Admitting: Hematology and Oncology

## 2021-11-25 ENCOUNTER — Other Ambulatory Visit (HOSPITAL_BASED_OUTPATIENT_CLINIC_OR_DEPARTMENT_OTHER): Payer: Self-pay | Admitting: Family Medicine

## 2021-11-25 ENCOUNTER — Emergency Department (HOSPITAL_BASED_OUTPATIENT_CLINIC_OR_DEPARTMENT_OTHER): Payer: Federal, State, Local not specified - PPO

## 2021-11-25 ENCOUNTER — Encounter (HOSPITAL_BASED_OUTPATIENT_CLINIC_OR_DEPARTMENT_OTHER): Payer: Self-pay

## 2021-11-25 ENCOUNTER — Other Ambulatory Visit: Payer: Self-pay

## 2021-11-25 ENCOUNTER — Ambulatory Visit (HOSPITAL_BASED_OUTPATIENT_CLINIC_OR_DEPARTMENT_OTHER): Admission: RE | Admit: 2021-11-25 | Payer: Federal, State, Local not specified - PPO | Source: Ambulatory Visit

## 2021-11-25 ENCOUNTER — Emergency Department (HOSPITAL_BASED_OUTPATIENT_CLINIC_OR_DEPARTMENT_OTHER)
Admission: EM | Admit: 2021-11-25 | Discharge: 2021-11-25 | Disposition: A | Discharge status: Home / Self Care | Payer: Federal, State, Local not specified - PPO | Attending: Emergency Medicine | Admitting: Emergency Medicine

## 2021-11-25 DIAGNOSIS — R062 Wheezing: Secondary | ICD-10-CM

## 2021-11-25 DIAGNOSIS — R0789 Other chest pain: Secondary | ICD-10-CM

## 2021-11-25 DIAGNOSIS — Z853 Personal history of malignant neoplasm of breast: Secondary | ICD-10-CM | POA: Diagnosis not present

## 2021-11-25 DIAGNOSIS — Z86718 Personal history of other venous thrombosis and embolism: Secondary | ICD-10-CM | POA: Diagnosis not present

## 2021-11-25 DIAGNOSIS — R0600 Dyspnea, unspecified: Secondary | ICD-10-CM

## 2021-11-25 DIAGNOSIS — R0602 Shortness of breath: Secondary | ICD-10-CM | POA: Diagnosis not present

## 2021-11-25 LAB — COMPREHENSIVE METABOLIC PANEL
ALT: 35 U/L (ref 0–44)
AST: 21 U/L (ref 15–41)
Albumin: 4.6 g/dL (ref 3.5–5.0)
Alkaline Phosphatase: 79 U/L (ref 38–126)
Anion gap: 9 (ref 5–15)
BUN: 13 mg/dL (ref 6–20)
CO2: 24 mmol/L (ref 22–32)
Calcium: 9.5 mg/dL (ref 8.9–10.3)
Chloride: 108 mmol/L (ref 98–111)
Creatinine, Ser: 0.98 mg/dL (ref 0.61–1.24)
GFR, Estimated: >60 mL/min (ref >60)
Glucose, Bld: 90 mg/dL (ref 70–99)
Potassium: 3.8 mmol/L (ref 3.5–5.1)
Sodium: 141 mmol/L (ref 135–145)
Total Bilirubin: 0.4 mg/dL (ref 0.3–1.2)
Total Protein: 7 g/dL (ref 6.5–8.1)

## 2021-11-25 LAB — CBC WITH DIFFERENTIAL/PLATELET
Abs Immature Granulocytes: 0.02 10*3/uL (ref 0.00–0.07)
Basophils Absolute: 0 10*3/uL (ref 0.0–0.1)
Basophils Relative: 1 %
Eosinophils Absolute: 0.3 10*3/uL (ref 0.0–0.5)
Eosinophils Relative: 4 %
HCT: 45.4 % (ref 39.0–52.0)
Hemoglobin: 15.5 g/dL (ref 13.0–17.0)
Immature Granulocytes: 0 %
Lymphocytes Relative: 49 %
Lymphs Abs: 3.1 10*3/uL (ref 0.7–4.0)
MCH: 31.2 pg (ref 26.0–34.0)
MCHC: 34.1 g/dL (ref 30.0–36.0)
MCV: 91.3 fL (ref 80.0–100.0)
Monocytes Absolute: 0.5 10*3/uL (ref 0.1–1.0)
Monocytes Relative: 8 %
Neutro Abs: 2.5 10*3/uL (ref 1.7–7.7)
Neutrophils Relative %: 38 %
Platelets: 252 10*3/uL (ref 150–400)
RBC: 4.97 MIL/uL (ref 4.22–5.81)
RDW: 12.7 % (ref 11.5–15.5)
WBC: 6.4 10*3/uL (ref 4.0–10.5)
nRBC: 0 % (ref 0.0–0.2)

## 2021-11-25 LAB — TROPONIN I (HIGH SENSITIVITY): Troponin I (High Sensitivity): 4 ng/L (ref <18)

## 2021-11-25 MED ORDER — IOHEXOL 350 MG/ML SOLN
75.0000 mL | Freq: Once | INTRAVENOUS | Status: AC | PRN
  Administered 2021-11-25: 75 mL via INTRAVENOUS

## 2021-11-25 MED ORDER — ALBUTEROL SULFATE HFA 108 (90 BASE) MCG/ACT IN AERS: 2.0000 | INHALATION_SPRAY | RESPIRATORY_TRACT | Status: DC | PRN

## 2021-11-25 NOTE — ED Notes (Signed)
Discharge paperwork given and verbally understood. 

## 2021-11-25 NOTE — Discharge Instructions (Addendum)
You were seen today for shortness of breath.  Your work-up was reassuring for no signs of acute coronary syndrome, no pneumonia, no signs of fluid overload, and no signs of pulmonary embolism.  I recommend you follow-up with your primary care provider for further evaluation and management.

## 2021-11-25 NOTE — ED Provider Notes (Signed)
[facility] EMERGENCY DEPT Provider Note   CSN: [id number] Arrival date & time: 11/25/21  1845     History  Chief Complaint  Patient presents with   Shortness of Breath    Antonio Smith is a 46 y.o. male.  Presents to the hospital complaining of 1 month of increasing shortness of breath.  Patient states that he has a remote history of pulmonary embolism approximately 2 years ago.  He is not currently anticoagulated.  He complains of a very intermittent and mild substernal chest pain.  He states that with any moderate level of exertion he is completely out of breath.  Patient also endorses cough which is intermittent over the past month.  Patient denies fevers, abdominal pain, nausea, vomiting.  Patient was evaluated today at [facility] who recommended he come to the emergency department for possible CT scan to rule out pulmonary embolism.  CT scan was ordered while the patient was in triage.  Patient denies leg swelling or leg pain, denies recent surgeries, recent travel.  Patient does endorse masses being removed from the left upper chest approximately 2 to 3 years ago and states that he works sitting in his chair approximately 8 hours a day.  Past medical history significant for previous pulmonary emboli, breast cancer, GERD, seizures, traumatic brain injury.  Patient was administered amoxicillin and albuterol while at [facility]  HPI     Home Medications Prior to Admission medications   Medication Sig Start Date End Date Taking? Authorizing Provider  albuterol (VENTOLIN HFA) 108 (90 Base) MCG/ACT inhaler Inhale 1 puff into the lungs every 4 (four) hours. 10/23/21   [provider]  amoxicillin-clavulanate (AUGMENTIN) 875-125 MG tablet Take 1 tablet by mouth every 12 (twelve) hours. 10/23/21   [provider]  atorvastatin (LIPITOR) 20 MG tablet Take 1 tablet (20 mg total) by mouth daily. 04/28/16   Nicholas Lose, MD  levETIRAcetam (KEPPRA XR)  500 MG 24 hr tablet Take 2 tablets (1,000 mg total) by mouth in the morning and at bedtime. 11/05/21 02/03/22  Alric Ran, MD      Allergies    Prednisone    Review of Systems   Review of Systems  Constitutional:  Negative for fever.  Respiratory:  Positive for cough and shortness of breath.   Cardiovascular:  Positive for chest pain (Intermittent as described above). Negative for leg swelling.  Gastrointestinal:  Negative for abdominal pain.  Genitourinary:  Negative for dysuria.  Neurological:  Negative for headaches.    Physical Exam Updated Vital Signs BP 122/67   Pulse 67   Temp 98 F (36.7 C)   Resp (!) 21   Ht '5\' 10"'$  (1.778 m)   Wt 113.4 kg   SpO2 99%   BMI 35.87 kg/m  Physical Exam Vitals and nursing note reviewed.  Constitutional:      General: He is not in acute distress.    Appearance: He is well-developed.  HENT:     Head: Normocephalic and atraumatic.  Eyes:     Extraocular Movements: Extraocular movements intact.     Conjunctiva/sclera: Conjunctivae normal.  Cardiovascular:     Rate and Rhythm: Normal rate and regular rhythm.     Heart sounds: No murmur heard. Pulmonary:     Effort: Pulmonary effort is normal. No respiratory distress.     Breath sounds: Normal breath sounds.  Chest:     Chest wall: No tenderness.  Abdominal:     Palpations: Abdomen is soft.  Tenderness: There is no abdominal tenderness.  Musculoskeletal:        General: No swelling.     Cervical back: Neck supple.     Right lower leg: No edema.     Left lower leg: No edema.  Skin:    General: Skin is warm and dry.     Capillary Refill: Capillary refill takes less than 2 seconds.  Neurological:     Mental Status: He is alert.  Psychiatric:        Mood and Affect: Mood normal.     ED Results / Procedures / Treatments   Labs (all labs ordered are listed, but only abnormal results are displayed) Labs Reviewed  CBC WITH DIFFERENTIAL/PLATELET  COMPREHENSIVE METABOLIC  PANEL  TROPONIN I (HIGH SENSITIVITY)    EKG EKG Interpretation  Date/Time:  Monday November 25 2021 19:13:08 EDT Ventricular Rate:  85 PR Interval:  144 QRS Duration: 90 QT Interval:  398 QTC Calculation: 473 R Axis:   59 Text Interpretation: Normal sinus rhythm Cannot rule out Inferior infarct , age undetermined Abnormal ECG When compared with ECG of 09-Feb-2012 17:46, T wave inversion now evident in Inferior leads Confirmed by Garnette Gunner [phone]) on 11/25/2021 9:37:40 PM  Radiology CT Angio Chest PE W/Cm &/Or Wo Cm  Result Date: 11/25/2021 CLINICAL DATA:  Recurrent shortness of breath EXAM: CT ANGIOGRAPHY CHEST WITH CONTRAST TECHNIQUE: Multidetector CT imaging of the chest was performed using the standard protocol during bolus administration of intravenous contrast. Multiplanar CT image reconstructions and MIPs were obtained to evaluate the vascular anatomy. RADIATION DOSE REDUCTION: This exam was performed according to the departmental dose-optimization program which includes automated exposure control, adjustment of the mA and/or kV according to patient size and/or use of iterative reconstruction technique. CONTRAST:  8m OMNIPAQUE IOHEXOL 350 MG/ML SOLN COMPARISON:  Previous studies including the chest radiograph done on 07/18/2021 FINDINGS: Cardiovascular: There are no intraluminal filling defects in central pulmonary artery branches. There are no demonstrable intimal flaps in thoracic aorta. Scattered coronary artery calcifications are seen. Mediastinum/Nodes: No significant lymphadenopathy seen. Lungs/Pleura: There is no focal pulmonary consolidation. There is no pleural effusion or pneumothorax. Upper Abdomen: No acute findings are seen. Musculoskeletal: Unremarkable. Review of the MIP images confirms the above findings. IMPRESSION: There is no evidence of pulmonary artery embolism. Scattered coronary artery calcifications are seen. There is no focal pulmonary consolidation. There is  no pleural effusion or pneumothorax. Electronically Signed   By: PElmer PickerM.D.   On: 11/25/2021 20:48    Procedures Procedures    Medications Ordered in ED Medications  albuterol (VENTOLIN HFA) 108 (90 Base) MCG/ACT inhaler 2 puff (has no administration in time range)  iohexol (OMNIPAQUE) 350 MG/ML injection 75 mL (75 mLs Intravenous Contrast Given 11/25/21 2033)    ED Course/ Medical Decision Making/ A&P                           Medical Decision Making Amount and/or Complexity of Data Reviewed Labs: ordered. Radiology: ordered.  Risk Prescription drug management.   This patient presents to the ED for concern of dyspnea, this involves an extensive number of treatment options, and is a complaint that carries with it a high risk of complications and morbidity.  The differential diagnosis includes but is not limited to pulmonary embolism, ACS, COPD, CHF, pneumonia, and others   Co morbidities that complicate the patient evaluation  History of pulmonary emboli   Additional history obtained:  Additional history obtained from family at bedside External records from outside source obtained and reviewed including neurology notes from September 7 that urged the patient to go to the urgent care or emergency department due to cough and shortness of breath at that time.   Lab Tests:  I Ordered, and personally interpreted labs.  The pertinent results include: Unremarkable CBC, CMP, troponin 4   Imaging Studies ordered:  I ordered imaging studies including CT angio chest PE study I independently visualized and interpreted imaging which showed  There is no evidence of pulmonary artery embolism. Scattered  coronary artery calcifications are seen. There is no focal pulmonary  consolidation. There is no pleural effusion or pneumothorax.   I agree with the radiologist interpretation   Cardiac Monitoring: / EKG:  The patient was maintained on a cardiac monitor.  I  personally viewed and interpreted the cardiac monitored which showed an underlying rhythm of: Sinus rhythm   Test / Admission - Considered:  No acute finding on CT PE study.  No sign of pneumonia, pulmonary embolism.  Troponin of 4 and nonischemic EKG showing no sign of ACS.  Lungs are clear to auscultation bilaterally.  No signs of fluid overload.  Unclear etiology of patient's dyspnea on exertion.  I recommend that he follow-up with his primary care provider for further evaluation and management.  Patient may eventually benefit from specialist evaluation.  Return precautions provided        Final Clinical Impression(s) / ED Diagnoses Final diagnoses:  Dyspnea, unspecified type    Rx / DC Orders ED Discharge Orders     None         Ronny Bacon 11/25/21 2251    Cristie Hem, MD 11/26/21 1204

## 2021-11-25 NOTE — ED Triage Notes (Signed)
Recurrent SHOB x 1 month Was seen at [facility] family practice Pt was asked to come here for further evaluation  Given Amoxicillin and albuterol tx there

## 2021-11-27 ENCOUNTER — Encounter: Payer: Self-pay | Admitting: Neurology

## 2021-11-27 ENCOUNTER — Ambulatory Visit: Payer: Federal, State, Local not specified - PPO | Admitting: Neurology

## 2021-12-23 ENCOUNTER — Ambulatory Visit: Payer: Federal, State, Local not specified - PPO | Attending: Internal Medicine | Admitting: Internal Medicine

## 2021-12-23 ENCOUNTER — Encounter: Payer: Self-pay | Admitting: Internal Medicine

## 2021-12-23 VITALS — BP 116/86 | HR 93 | Ht 70.0 in | Wt 257.8 lb

## 2021-12-23 DIAGNOSIS — R0602 Shortness of breath: Secondary | ICD-10-CM

## 2021-12-23 MED ORDER — METOPROLOL TARTRATE 100 MG PO TABS: ORAL_TABLET | ORAL | 0 refills | Status: DC

## 2021-12-23 NOTE — Patient Instructions (Addendum)
Medication Instructions:  Continue same   Lab Work: Bmet,bnp,lipid panel to be done 10/31 at Dr.Branch's office Lab order enclosed   Testing/Procedures: Coronary Ct   will be scheduled after approved by insurance   Follow instructions below   Follow-Up: At [facility], you and your health needs are our priority.  As part of our continuing mission to provide you with exceptional heart care, we have created designated Provider Care Teams.  These Care Teams include your primary Cardiologist (physician) and Advanced Practice Providers (APPs -  Physician Assistants and Nurse Practitioners) who all work together to provide you with the care you need, when you need it.  We recommend signing up for the patient portal called "MyChart".  Sign up information is provided on this After Visit Summary.  MyChart is used to connect with patients for Virtual Visits (Telemedicine).  Patients are able to view lab/test results, encounter notes, upcoming appointments, etc.  Non-urgent messages can be sent to your provider as well.   To learn more about what you can do with MyChart, go to [email or web address].    Your next appointment:  As Needed    The format for your next appointment: Office  Provider:  Dr.Branch      Your cardiac CT will be scheduled at one of the below locations:   [facility] [address] [place], [place] [address] [phone]  [place] [address] [place], [place] [address] [phone]  [facility] [place], [place] [address] [phone]  If scheduled at [facility], please arrive at the [facility] and Children's Entrance (Entrance C2) of [facility] 30 minutes prior to test start time. You can use the FREE valet parking offered at entrance C (encouraged to control the heart rate for the test)  Proceed to the [facility]  Radiology Department (first floor) to check-in and test prep.  All radiology patients and guests should use entrance C2 at [facility], accessed from [facility], even though the hospital's physical address listed is [address]    If scheduled at [facility] or [facility], please arrive 15 mins early for check-in and test prep.   Please follow these instructions carefully (unless otherwise directed):  Hold all erectile dysfunction medications at least 3 days (72 hrs) prior to test. (Ie viagra, cialis, sildenafil, tadalafil, etc) We will administer nitroglycerin during this exam.   On the Night Before the Test: Be sure to Drink plenty of water. Do not consume any caffeinated/decaffeinated beverages or chocolate 12 hours prior to your test. Do not take any antihistamines 12 hours prior to your test.   On the Day of the Test: Drink plenty of water until 1 hour prior to the test. Do not eat any food 1 hour prior to test. You may take your regular medications prior to the test.  Take metoprolol 100 mg two hours prior to test.           After the Test: Drink plenty of water. After receiving IV contrast, you may experience a mild flushed feeling. This is normal. On occasion, you may experience a mild rash up to 24 hours after the test. This is not dangerous. If this occurs, you can take Benadryl 25 mg and increase your fluid intake. If you experience trouble breathing, this can be serious. If it is severe call  [phone] IMMEDIATELY. If it is mild, please call our office.   We will call to schedule your test 2-4 weeks out understanding that some insurance companies will need an authorization prior to the service being performed.   For non-scheduling related questions, please contact the cardiac imaging nurse navigator should you have any questions/concerns: Marchia Bond, Cardiac Imaging Nurse Navigator Gordy Clement, Cardiac Imaging Nurse Navigator [facility] Direct Office Dial: [phone]   For scheduling needs, including cancellations and rescheduling, please call [place], [phone].   Important Information About Sugar
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
## 2021-12-24 LAB — LIPID PANEL
Chol/HDL Ratio: 5 ratio (ref 0.0–5.0)
Cholesterol, Total: 195 mg/dL (ref 100–199)
HDL: 39 mg/dL — ABNORMAL LOW (ref >39)
LDL Chol Calc (NIH): 138 mg/dL — ABNORMAL HIGH (ref 0–99)
Triglycerides: 101 mg/dL (ref 0–149)
VLDL Cholesterol Cal: 18 mg/dL (ref 5–40)

## 2021-12-24 LAB — BASIC METABOLIC PANEL
BUN/Creatinine Ratio: 12 (ref 9–20)
BUN: 13 mg/dL (ref 6–24)
CO2: 23 mmol/L (ref 20–29)
Calcium: 9.7 mg/dL (ref 8.7–10.2)
Chloride: 108 mmol/L — ABNORMAL HIGH (ref 96–106)
Creatinine, Ser: 1.06 mg/dL (ref 0.76–1.27)
Glucose: 91 mg/dL (ref 70–99)
Potassium: 4.5 mmol/L (ref 3.5–5.2)
Sodium: 144 mmol/L (ref 134–144)
eGFR: 88 mL/min/{1.73_m2} (ref >59)

## 2021-12-24 LAB — BRAIN NATRIURETIC PEPTIDE: BNP: 20.5 pg/mL (ref 0.0–100.0)

## 2021-12-25 ENCOUNTER — Ambulatory Visit: Payer: Federal, State, Local not specified - PPO | Admitting: Pulmonary Disease

## 2021-12-25 ENCOUNTER — Encounter: Payer: Self-pay | Admitting: Pulmonary Disease

## 2021-12-25 ENCOUNTER — Ambulatory Visit (INDEPENDENT_AMBULATORY_CARE_PROVIDER_SITE_OTHER): Payer: Federal, State, Local not specified - PPO | Admitting: Pulmonary Disease

## 2021-12-25 VITALS — BP 134/76 | HR 85 | Ht 71.0 in | Wt 260.8 lb

## 2021-12-25 DIAGNOSIS — R0609 Other forms of dyspnea: Secondary | ICD-10-CM | POA: Diagnosis not present

## 2021-12-25 LAB — PULMONARY FUNCTION TEST
DL/VA % pred: 123 %
DL/VA: 5.56 ml/min/mmHg/L
DLCO cor % pred: 70 %
DLCO cor: 21.15 ml/min/mmHg
DLCO unc % pred: 72 %
DLCO unc: 21.67 ml/min/mmHg
FEF 25-75 Post: 6.38 L/sec
FEF 25-75 Pre: 1.68 L/sec
FEF2575-%Change-Post: 279 %
FEF2575-%Pred-Post: 173 %
FEF2575-%Pred-Pre: 45 %
FEV1-%Change-Post: 51 %
FEV1-%Pred-Post: 77 %
FEV1-%Pred-Pre: 50 %
FEV1-Post: 3.12 L
FEV1-Pre: 2.06 L
FEV1FVC-%Change-Post: 11 %
FEV1FVC-%Pred-Pre: 98 %
FEV6-%Change-Post: 35 %
FEV6-%Pred-Post: 72 %
FEV6-%Pred-Pre: 53 %
FEV6-Post: 3.62 L
FEV6-Pre: 2.66 L
FEV6FVC-%Pred-Post: 103 %
FEV6FVC-%Pred-Pre: 103 %
FVC-%Change-Post: 35 %
FVC-%Pred-Post: 70 %
FVC-%Pred-Pre: 51 %
FVC-Post: 3.62 L
FVC-Pre: 2.66 L
Post FEV1/FVC ratio: 86 %
Post FEV6/FVC ratio: 100 %
Pre FEV1/FVC ratio: 77 %
Pre FEV6/FVC Ratio: 100 %
RV % pred: 106 %
RV: 2.08 L
TLC % pred: 74 %
TLC: 5.19 L

## 2021-12-25 MED ORDER — FLUTICASONE-SALMETEROL 500-50 MCG/ACT IN AEPB: 1.0000 | INHALATION_SPRAY | Freq: Two times a day (BID) | RESPIRATORY_TRACT | 3 refills | Status: AC

## 2021-12-25 NOTE — Patient Instructions (Signed)
Full PFT Performed Today  

## 2021-12-25 NOTE — Progress Notes (Signed)
Full PFT Performed Today  

## 2021-12-25 NOTE — Patient Instructions (Addendum)
Nice to see you   Use Advair 1 puff twice a day every day  I ordered pulmonary function test to further evaluate your symptoms, we will get these as soon as possible  I am glad you are seeing cardiology for ongoing work-up  Return to clinic in 2 months or sooner as needed with Dr. Silas Flood
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
## 2022-01-03 ENCOUNTER — Telehealth (HOSPITAL_COMMUNITY): Payer: Self-pay | Admitting: *Deleted

## 2022-01-03 NOTE — Telephone Encounter (Signed)
Attempted to call patient regarding upcoming cardiac CT appointment. °Left message on voicemail with name and callback number ° °Arasely Akkerman RN Navigator Cardiac Imaging °[facility] °[phone] Office °[phone] Cell ° °

## 2022-01-06 ENCOUNTER — Ambulatory Visit (HOSPITAL_COMMUNITY)
Admission: RE | Admit: 2022-01-06 | Discharge: 2022-01-06 | Disposition: A | Discharge status: Home / Self Care | Payer: Federal, State, Local not specified - PPO | Source: Ambulatory Visit | Attending: Internal Medicine | Admitting: Internal Medicine

## 2022-01-06 ENCOUNTER — Encounter: Payer: Self-pay | Admitting: Neurology

## 2022-01-06 ENCOUNTER — Encounter (HOSPITAL_COMMUNITY): Payer: Self-pay

## 2022-01-06 DIAGNOSIS — R0602 Shortness of breath: Secondary | ICD-10-CM | POA: Insufficient documentation

## 2022-01-06 MED ORDER — METOPROLOL TARTRATE 5 MG/5ML IV SOLN: 5.0000 mg | INTRAVENOUS | Status: DC | PRN

## 2022-01-06 MED ORDER — NITROGLYCERIN 0.4 MG SL SUBL: 0.8000 mg | SUBLINGUAL_TABLET | Freq: Once | SUBLINGUAL | Status: DC

## 2022-01-06 MED ORDER — METOPROLOL TARTRATE 5 MG/5ML IV SOLN
INTRAVENOUS | Status: AC
  Administered 2022-01-06: 10 mg via INTRAVENOUS
  Filled 2022-01-06: qty 10

## 2022-01-06 NOTE — Progress Notes (Signed)
   01/06/22 0824 01/06/22 0846  Vital Signs  Pulse Rate (!) 104 77  Pulse Rate Source Monitor Monitor  BP (!) 140/84 112/74  MAP (mmHg) 96 84  BP Location Left Arm Left Arm  BP Method Automatic Automatic  Patient Position (if appropriate) Sitting Sitting   Patient at radiology department for CT scan of heart. Patient states he can't recall what time he took Metoprolol pill. Heart rate on arrival sinus tach @ 104. Dr. Aundra Dubin informed of patient heart rate on arrival and shortness of breath. IV started and '10mg'$  of IV metoprolol given. Patient blood pressure 112/74 after first dose and heart rate 77-90 after administration. Decision made to cancel scan and reschedule. CT heart navigator nurse made aware that patient may require Ivabardine for heart rate control per Dr. Aundra Dubin. Patient informed of reason of cancellation and states understanding.

## 2022-01-07 NOTE — Telephone Encounter (Signed)
Yes, okay to provide letter. Thanks

## 2022-01-08 NOTE — Telephone Encounter (Signed)
Letter completed and placed in MD's office for review and signature.

## 2022-02-10 ENCOUNTER — Encounter: Payer: Self-pay | Admitting: Neurology

## 2022-02-10 NOTE — Telephone Encounter (Signed)
Of course. Thanks

## 2022-02-11 ENCOUNTER — Encounter: Payer: Self-pay | Admitting: Neurology

## 2022-02-11 NOTE — Telephone Encounter (Signed)
Letter has been completed. Placed on MD desk to sign.

## 2022-02-11 NOTE — Telephone Encounter (Signed)
Pt called stating that his employer is needing a letter every 30 days regarding his not being able to drive to work and he states that the last letter is up on Thurs. Please advise.

## 2022-02-19 ENCOUNTER — Telehealth (HOSPITAL_COMMUNITY): Payer: Self-pay | Admitting: Emergency Medicine

## 2022-02-19 DIAGNOSIS — R079 Chest pain, unspecified: Secondary | ICD-10-CM

## 2022-02-19 MED ORDER — METOPROLOL TARTRATE 100 MG PO TABS: ORAL_TABLET | ORAL | 0 refills | Status: DC

## 2022-02-19 NOTE — Telephone Encounter (Signed)
Reaching out to patient to offer assistance regarding upcoming cardiac imaging study; pt verbalizes understanding of appt date/time, parking situation and where to check in, pre-test NPO status and medications ordered, and verified current allergies; name and call back number provided for further questions should they arise Marchia Bond RN Navigator Cardiac Imaging [facility] [phone] office [phone] cell  Arrival 1030  Pt reminded to take '100mg'$  metoprolol for CCTA 2 hr prior to appt Denies iv issues

## 2022-02-20 ENCOUNTER — Ambulatory Visit (HOSPITAL_BASED_OUTPATIENT_CLINIC_OR_DEPARTMENT_OTHER)
Admission: RE | Admit: 2022-02-20 | Discharge: 2022-02-20 | Disposition: A | Discharge status: Home / Self Care | Payer: Federal, State, Local not specified - PPO | Source: Ambulatory Visit | Attending: Cardiovascular Disease | Admitting: Cardiovascular Disease

## 2022-02-20 ENCOUNTER — Ambulatory Visit (HOSPITAL_COMMUNITY)
Admission: RE | Admit: 2022-02-20 | Discharge: 2022-02-20 | Disposition: A | Discharge status: Home / Self Care | Payer: Federal, State, Local not specified - PPO | Source: Ambulatory Visit | Attending: Internal Medicine | Admitting: Internal Medicine

## 2022-02-20 ENCOUNTER — Other Ambulatory Visit: Payer: Self-pay | Admitting: Cardiovascular Disease

## 2022-02-20 DIAGNOSIS — R079 Chest pain, unspecified: Secondary | ICD-10-CM

## 2022-02-20 DIAGNOSIS — R0602 Shortness of breath: Secondary | ICD-10-CM | POA: Diagnosis not present

## 2022-02-20 DIAGNOSIS — I2510 Atherosclerotic heart disease of native coronary artery without angina pectoris: Secondary | ICD-10-CM

## 2022-02-20 DIAGNOSIS — R931 Abnormal findings on diagnostic imaging of heart and coronary circulation: Secondary | ICD-10-CM | POA: Diagnosis not present

## 2022-02-20 MED ORDER — NITROGLYCERIN 0.4 MG SL SUBL
SUBLINGUAL_TABLET | SUBLINGUAL | Status: AC
  Filled 2022-02-20: qty 2

## 2022-02-20 MED ORDER — IOHEXOL 350 MG/ML SOLN
95.0000 mL | Freq: Once | INTRAVENOUS | Status: AC | PRN
  Administered 2022-02-20: 95 mL via INTRAVENOUS

## 2022-02-20 MED ORDER — NITROGLYCERIN 0.4 MG SL SUBL
0.8000 mg | SUBLINGUAL_TABLET | Freq: Once | SUBLINGUAL | Status: AC
  Administered 2022-02-20: 0.8 mg via SUBLINGUAL

## 2022-02-25 ENCOUNTER — Telehealth: Payer: Self-pay | Admitting: Internal Medicine

## 2022-02-25 DIAGNOSIS — R0602 Shortness of breath: Secondary | ICD-10-CM

## 2022-02-25 DIAGNOSIS — R9389 Abnormal findings on diagnostic imaging of other specified body structures: Secondary | ICD-10-CM

## 2022-02-25 NOTE — Telephone Encounter (Signed)
Attempted to call patient on all numbers listed in chart. Unable to reach or leave message at this time. Will try again at another time.   Janina Mayo, MD  Dusty Wagoner, Belinda Block, RN Hi Antonio Smith, please schedule a left heart catheterization for Antonio Smith. I'll place consent in the telephone encounter. I discussed the results with him  Please start aspirin 81 mg. Please increase his lipitor to 80 mg daily. Please start metop 12.5 mg XL daily.

## 2022-02-25 NOTE — Telephone Encounter (Signed)
Called Antonio Smith and discuss his coronary CT results. He has flow limiting lesion in his RCA. He continues to have SOB. Will therefore plan for LHC. We discussed this and are making medication changes.  Shared Decision Making/Informed Consent The risks [stroke (1 in 1000), death (1 in 1000), kidney failure [usually temporary] (1 in 500), bleeding (1 in 200), allergic reaction [possibly serious] (1 in 200)], benefits (diagnostic support and management of coronary artery disease) and alternatives of a cardiac catheterization were discussed in detail with [name] and he is willing to proceed.

## 2022-02-26 NOTE — Telephone Encounter (Signed)
Called and spoke with patient who would prefer to have Dubois done Friday 1/5- this has been scheduled for Friday at 5:30am with Dr. Martinique.   Patient needs updated H&P and EKG and blood work-  patient scheduled to see Dr. Harl Bowie tomorrow morning at 8:00am will have EKG completed and blood work.  Patient aware of all instructions and verbalized understanding.

## 2022-02-26 NOTE — Addendum Note (Signed)
Addended by: Rexanne Mano B on: 02/26/2022 02:21 PM   Modules accepted: Orders

## 2022-02-26 NOTE — Telephone Encounter (Signed)
Called and spoke with patient regarding heart cath- patient is unsure about when he is able to do this procedure and will call back to the office to get procedure scheduled. Gave patient call back number.

## 2022-02-27 ENCOUNTER — Encounter: Payer: Self-pay | Admitting: Internal Medicine

## 2022-02-27 ENCOUNTER — Ambulatory Visit: Payer: Federal, State, Local not specified - PPO | Attending: Internal Medicine | Admitting: Internal Medicine

## 2022-02-27 DIAGNOSIS — R0602 Shortness of breath: Secondary | ICD-10-CM

## 2022-02-27 DIAGNOSIS — R9389 Abnormal findings on diagnostic imaging of other specified body structures: Secondary | ICD-10-CM | POA: Diagnosis not present

## 2022-02-27 LAB — BASIC METABOLIC PANEL
BUN/Creatinine Ratio: 28 — ABNORMAL HIGH (ref 9–20)
BUN: 15 mg/dL (ref 6–24)
CO2: 22 mmol/L (ref 20–29)
Calcium: 9.1 mg/dL (ref 8.7–10.2)
Chloride: 108 mmol/L — ABNORMAL HIGH (ref 96–106)
Creatinine, Ser: 0.53 mg/dL — ABNORMAL LOW (ref 0.76–1.27)
Glucose: 98 mg/dL (ref 70–99)
Potassium: 4.3 mmol/L (ref 3.5–5.2)
Sodium: 143 mmol/L (ref 134–144)
eGFR: 125 mL/min/{1.73_m2} (ref >59)

## 2022-02-27 MED ORDER — METOPROLOL SUCCINATE ER 25 MG PO TB24: 12.5000 mg | ORAL_TABLET | Freq: Every day | ORAL | 3 refills | Status: DC

## 2022-02-27 MED ORDER — ATORVASTATIN CALCIUM 80 MG PO TABS: 80.0000 mg | ORAL_TABLET | Freq: Every day | ORAL | 3 refills | Status: DC

## 2022-02-27 MED ORDER — ASPIRIN 81 MG PO TBEC: 81.0000 mg | DELAYED_RELEASE_TABLET | Freq: Every day | ORAL | 3 refills | Status: AC

## 2022-02-27 NOTE — Patient Instructions (Signed)
Medication Instructions:   START: ASPIRIN '81mg'$  ONCE DAILY   START: ATORVASTATIN '80mg'$  ONCE DAILY   START: METOPROLOL SUCCINATE 12.'5mg'$  ONCE DAILY   *If you need a refill on your cardiac medications before your next appointment, please call your pharmacy*  Lab Work: BLOOD WORK TODAY   If you have labs (blood work) drawn today and your tests are completely normal, you will receive your results only by: [place] (if you have MyChart) OR A paper copy in the mail If you have any lab test that is abnormal or we need to change your treatment, we will call you to review the results.  Testing/Procedures: Your physician has requested that you have a cardiac catheterization. Cardiac catheterization is used to diagnose and/or treat various heart conditions. Doctors may recommend this procedure for a number of different reasons. The most common reason is to evaluate chest pain. Chest pain can be a symptom of coronary artery disease (CAD), and cardiac catheterization can show whether plaque is narrowing or blocking your heart's arteries. This procedure is also used to evaluate the valves, as well as measure the blood flow and oxygen levels in different parts of your heart. For further information please visit HugeFiesta.[email or web address]. Please follow instruction sheet, as given.  Follow-Up: At [facility], you and your health needs are our priority.  As part of our continuing mission to provide you with exceptional heart care, we have created designated Provider Care Teams.  These Care Teams include your primary Cardiologist (physician) and Advanced Practice Providers (APPs -  Physician Assistants and Nurse Practitioners) who all work together to provide you with the care you need, when you need it.  Your next appointment:   1 month(s)  The format for your next appointment:   In Person  Provider:   Janina Mayo, MD     Other Instructions  [place] A DEPT [place] A DEPT OF . [facility] [place] [id number] [place] [address] Dept: [phone] Loc: [place]  02/27/2022  You are scheduled for a Cardiac Catheterization on Friday, January 5 with Dr. Peter Martinique.  1. Please arrive at the [facility] (Main Entrance A) at [facility]: [address] [place], [place] [address] at 5:30 AM (This time is two hours before your procedure to ensure your preparation). Free valet parking service is available.   Special note: Every effort is made to have your procedure done on time. Please understand that emergencies sometimes delay scheduled procedures.  2. Diet: Do not eat solid foods after midnight.  The patient may have clear liquids until 5am upon the day of the procedure.  3. Labs: You will need to have blood drawn on Thursday, January 4 at [place]  Open: [place] (Lunch 12:30 - 1:30)   Phone: [phone]. You do not need to be fasting.  4. Medication instructions in preparation for your procedure:   Contrast Allergy: No  On the morning of your procedure, take your Aspirin 81 mg and any morning medicines NOT listed above.  You may use sips of water.  5. Plan for one night stay--bring personal belongings. 6. Bring a current list of your medications and current insurance cards. 7. You MUST have a responsible person to drive you home. 8. Someone MUST be with you the first 24 hours after you arrive home or  your discharge will be delayed. 9. Please wear clothes that are easy to get on and off and wear slip-on shoes.  Thank you for allowing [place] to care for you!   -- [place] [facility]
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
## 2022-02-28 ENCOUNTER — Encounter (HOSPITAL_COMMUNITY)
Admission: RE | Disposition: A | Discharge status: Home / Self Care | Payer: Self-pay | Source: Home / Self Care | Attending: Cardiology

## 2022-02-28 ENCOUNTER — Encounter (HOSPITAL_COMMUNITY): Payer: Self-pay | Admitting: Cardiology

## 2022-02-28 ENCOUNTER — Ambulatory Visit (HOSPITAL_COMMUNITY)
Admission: RE | Admit: 2022-02-28 | Discharge: 2022-02-28 | Disposition: A | Discharge status: Home / Self Care | Payer: Federal, State, Local not specified - PPO | Attending: Cardiology | Admitting: Cardiology

## 2022-02-28 ENCOUNTER — Other Ambulatory Visit: Payer: Self-pay

## 2022-02-28 DIAGNOSIS — F17210 Nicotine dependence, cigarettes, uncomplicated: Secondary | ICD-10-CM | POA: Diagnosis not present

## 2022-02-28 DIAGNOSIS — R0602 Shortness of breath: Secondary | ICD-10-CM | POA: Insufficient documentation

## 2022-02-28 DIAGNOSIS — I25118 Atherosclerotic heart disease of native coronary artery with other forms of angina pectoris: Secondary | ICD-10-CM | POA: Diagnosis not present

## 2022-02-28 HISTORY — PX: LEFT HEART CATH AND CORONARY ANGIOGRAPHY: CATH118249

## 2022-02-28 LAB — CBC
Hematocrit: 46.5 % (ref 37.5–51.0)
Hemoglobin: 16 g/dL (ref 13.0–17.7)
MCH: 31.3 pg (ref 26.6–33.0)
MCHC: 34.4 g/dL (ref 31.5–35.7)
MCV: 91 fL (ref 79–97)
Platelets: 244 10*3/uL (ref 150–450)
RBC: 5.11 x10E6/uL (ref 4.14–5.80)
RDW: 12.7 % (ref 11.6–15.4)
WBC: 4.8 10*3/uL (ref 3.4–10.8)

## 2022-02-28 SURGERY — LEFT HEART CATH AND CORONARY ANGIOGRAPHY
Anesthesia: LOCAL

## 2022-02-28 MED ORDER — SODIUM CHLORIDE 0.9 % WEIGHT BASED INFUSION: 1.0000 mL/kg/h | INTRAVENOUS | Status: DC

## 2022-02-28 MED ORDER — SODIUM CHLORIDE 0.9% FLUSH: 3.0000 mL | Freq: Two times a day (BID) | INTRAVENOUS | Status: DC

## 2022-02-28 MED ORDER — VERAPAMIL HCL 2.5 MG/ML IV SOLN
INTRAVENOUS | Status: DC | PRN
  Administered 2022-02-28: 10 mL via INTRA_ARTERIAL

## 2022-02-28 MED ORDER — HEPARIN SODIUM (PORCINE) 1000 UNIT/ML IJ SOLN
INTRAMUSCULAR | Status: AC
  Filled 2022-02-28: qty 10

## 2022-02-28 MED ORDER — MIDAZOLAM HCL 2 MG/2ML IJ SOLN
INTRAMUSCULAR | Status: AC
  Filled 2022-02-28: qty 2

## 2022-02-28 MED ORDER — IOHEXOL 350 MG/ML SOLN
INTRAVENOUS | Status: DC | PRN
  Administered 2022-02-28: 50 mL

## 2022-02-28 MED ORDER — SODIUM CHLORIDE 0.9 % WEIGHT BASED INFUSION
3.0000 mL/kg/h | INTRAVENOUS | Status: AC
  Administered 2022-02-28: 3 mL/kg/h via INTRAVENOUS

## 2022-02-28 MED ORDER — FENTANYL CITRATE (PF) 100 MCG/2ML IJ SOLN
INTRAMUSCULAR | Status: AC
  Filled 2022-02-28: qty 2

## 2022-02-28 MED ORDER — ASPIRIN 81 MG PO CHEW
81.0000 mg | CHEWABLE_TABLET | ORAL | Status: AC
  Administered 2022-02-28: 81 mg via ORAL
  Filled 2022-02-28: qty 1

## 2022-02-28 MED ORDER — ACETAMINOPHEN 325 MG PO TABS: 650.0000 mg | ORAL_TABLET | ORAL | Status: DC | PRN

## 2022-02-28 MED ORDER — SODIUM CHLORIDE 0.9% FLUSH: 3.0000 mL | INTRAVENOUS | Status: DC | PRN

## 2022-02-28 MED ORDER — HEPARIN (PORCINE) IN NACL 1000-0.9 UT/500ML-% IV SOLN
INTRAVENOUS | Status: DC | PRN
  Administered 2022-02-28 (×2): 500 mL

## 2022-02-28 MED ORDER — LIDOCAINE HCL (PF) 1 % IJ SOLN
INTRAMUSCULAR | Status: AC
  Filled 2022-02-28: qty 30

## 2022-02-28 MED ORDER — VERAPAMIL HCL 2.5 MG/ML IV SOLN
INTRAVENOUS | Status: AC
  Filled 2022-02-28: qty 2

## 2022-02-28 MED ORDER — FENTANYL CITRATE (PF) 100 MCG/2ML IJ SOLN
INTRAMUSCULAR | Status: DC | PRN
  Administered 2022-02-28: 25 ug via INTRAVENOUS

## 2022-02-28 MED ORDER — SODIUM CHLORIDE 0.9 % IV SOLN: 250.0000 mL | INTRAVENOUS | Status: DC | PRN

## 2022-02-28 MED ORDER — LIDOCAINE HCL (PF) 1 % IJ SOLN
INTRAMUSCULAR | Status: DC | PRN
  Administered 2022-02-28: 2 mL

## 2022-02-28 MED ORDER — MIDAZOLAM HCL 2 MG/2ML IJ SOLN
INTRAMUSCULAR | Status: DC | PRN
  Administered 2022-02-28: 2 mg via INTRAVENOUS

## 2022-02-28 MED ORDER — ONDANSETRON HCL 4 MG/2ML IJ SOLN: 4.0000 mg | Freq: Four times a day (QID) | INTRAMUSCULAR | Status: DC | PRN

## 2022-02-28 MED ORDER — HEPARIN SODIUM (PORCINE) 1000 UNIT/ML IJ SOLN
INTRAMUSCULAR | Status: DC | PRN
  Administered 2022-02-28: 5000 [IU] via INTRAVENOUS

## 2022-02-28 SURGICAL SUPPLY — 9 items
CATH 5FR JL3.5 JR4 ANG PIG MP (CATHETERS) IMPLANT
DEVICE RAD COMP TR BAND LRG (VASCULAR PRODUCTS) IMPLANT
GLIDESHEATH SLEND SS 6F .021 (SHEATH) IMPLANT
GUIDEWIRE INQWIRE 1.5J.035X260 (WIRE) IMPLANT
INQWIRE 1.5J .035X260CM (WIRE) ×1
KIT HEART LEFT (KITS) ×1 IMPLANT
PACK CARDIAC CATHETERIZATION (CUSTOM PROCEDURE TRAY) ×1 IMPLANT
TRANSDUCER W/STOPCOCK (MISCELLANEOUS) ×1 IMPLANT
TUBING CIL FLEX 10 FLL-RA (TUBING) ×1 IMPLANT

## 2022-02-28 NOTE — Interval H&P Note (Signed)
History and Physical Interval Note:  02/28/2022 7:14 AM  Antonio Smith  has presented today for surgery, with the diagnosis of abnormal coronary cta.  The various methods of treatment have been discussed with the patient and family. After consideration of risks, benefits and other options for treatment, the patient has consented to  Procedure(s): LEFT HEART CATH AND CORONARY ANGIOGRAPHY (N/A) as a surgical intervention.  The patient's history has been reviewed, patient examined, no change in status, stable for surgery.  I have reviewed the patient's chart and labs.  Questions were answered to the patient's satisfaction.    Cath Lab Visit (complete for each Cath Lab visit)  Clinical Evaluation Leading to the Procedure:   ACS: No.  Non-ACS:    Anginal Classification: CCS III  Anti-ischemic medical therapy: Minimal Therapy (1 class of medications)  Non-Invasive Test Results: Intermediate-risk stress test findings: cardiac mortality 1-3%/year  Prior CABG: No previous CABG       [facility] 02/28/2022 7:14 AM

## 2022-03-31 ENCOUNTER — Ambulatory Visit: Payer: Federal, State, Local not specified - PPO | Attending: Internal Medicine | Admitting: Internal Medicine

## 2022-03-31 ENCOUNTER — Encounter: Payer: Self-pay | Admitting: Internal Medicine

## 2022-03-31 VITALS — BP 142/80 | HR 84 | Ht 70.0 in | Wt 256.6 lb

## 2022-03-31 DIAGNOSIS — I25118 Atherosclerotic heart disease of native coronary artery with other forms of angina pectoris: Secondary | ICD-10-CM | POA: Diagnosis not present

## 2022-03-31 DIAGNOSIS — R0602 Shortness of breath: Secondary | ICD-10-CM | POA: Diagnosis not present

## 2022-03-31 MED ORDER — NITROGLYCERIN 0.4 MG SL SUBL: 0.4000 mg | SUBLINGUAL_TABLET | SUBLINGUAL | 3 refills | Status: DC | PRN
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
## 2022-03-31 NOTE — Patient Instructions (Signed)
Medication Instructions:  Your physician has recommended you make the following change in your medication:  START: Nitroglycerin 0.4 mg place 1 tablet under your tongue every 5 minutes (up to three times) as needed for chest pain.  *If you need a refill on your cardiac medications before your next appointment, please call your pharmacy*   Lab Work: Your physician recommends that you return to have the following lab drawn in 3 months : Lipid Profile  If you have labs (blood work) drawn today and your tests are completely normal, you will receive your results only by: [place] (if you have MyChart) OR A paper copy in the mail If you have any lab test that is abnormal or we need to change your treatment, we will call you to review the results.   Testing/Procedures: NONE   Follow-Up: At [facility], you and your health needs are our priority.  As part of our continuing mission to provide you with exceptional heart care, we have created designated Provider Care Teams.  These Care Teams include your primary Cardiologist (physician) and Advanced Practice Providers (APPs -  Physician Assistants and Nurse Practitioners) who all work together to provide you with the care you need, when you need it.  We recommend signing up for the patient portal called "MyChart".  Sign up information is provided on this After Visit Summary.  MyChart is used to connect with patients for Virtual Visits (Telemedicine).  Patients are able to view lab/test results, encounter notes, upcoming appointments, etc.  Non-urgent messages can be sent to your provider as well.   To learn more about what you can do with MyChart, go to [email or web address].    Your next appointment:   1 year(s)  Provider:   Janina Mayo, MD

## 2022-04-10 ENCOUNTER — Telehealth: Payer: Self-pay | Admitting: Internal Medicine

## 2022-04-10 NOTE — Telephone Encounter (Signed)
*  STAT* If patient is at the pharmacy, call can be transferred to refill team.   1. Which medications need to be refilled? (please list name of each medication and dose if known)   atorvastatin (LIPITOR) 80 MG tablet   2. Which pharmacy/location (including street and city if local pharmacy) is medication to be sent to?  [place], [place] AT [address]   3. Do they need a 30 day or 90 day supply?   90 day  Patient stated he is completely out of this medication.

## 2022-04-14 ENCOUNTER — Other Ambulatory Visit: Payer: Self-pay

## 2022-06-11 ENCOUNTER — Encounter: Payer: Self-pay | Admitting: Internal Medicine

## 2022-06-11 DIAGNOSIS — Z0271 Encounter for disability determination: Secondary | ICD-10-CM

## 2022-06-11 MED ORDER — ATORVASTATIN CALCIUM 80 MG PO TABS: 80.0000 mg | ORAL_TABLET | Freq: Every day | ORAL | 3 refills | Status: DC

## 2022-08-01 ENCOUNTER — Encounter: Payer: Self-pay | Admitting: Pharmacist

## 2022-08-01 ENCOUNTER — Other Ambulatory Visit: Payer: Self-pay | Admitting: Internal Medicine

## 2022-08-01 ENCOUNTER — Other Ambulatory Visit (HOSPITAL_COMMUNITY): Payer: Self-pay

## 2022-08-01 ENCOUNTER — Other Ambulatory Visit: Payer: Self-pay

## 2022-08-01 MED ORDER — NITROGLYCERIN 0.4 MG SL SUBL
0.4000 mg | SUBLINGUAL_TABLET | SUBLINGUAL | 3 refills | Status: AC | PRN
  Filled 2022-08-01: qty 25, 7d supply, fill #0
  Filled 2022-08-27: qty 25, 15d supply, fill #0
  Filled 2023-03-02: qty 25, 7d supply, fill #0
  Filled 2023-03-03: qty 25, 8d supply, fill #0
  Filled 2023-04-21: qty 25, 1d supply, fill #0

## 2022-08-05 ENCOUNTER — Other Ambulatory Visit (HOSPITAL_COMMUNITY): Payer: Self-pay

## 2022-08-06 ENCOUNTER — Other Ambulatory Visit: Payer: Self-pay

## 2022-08-11 ENCOUNTER — Encounter: Payer: Self-pay | Admitting: Neurology

## 2022-08-27 ENCOUNTER — Other Ambulatory Visit (HOSPITAL_COMMUNITY): Payer: Self-pay

## 2022-08-27 ENCOUNTER — Telehealth: Payer: Self-pay | Admitting: Internal Medicine

## 2022-08-27 ENCOUNTER — Other Ambulatory Visit: Payer: Self-pay

## 2022-08-27 ENCOUNTER — Encounter: Payer: Self-pay | Admitting: Pharmacist

## 2022-08-27 DIAGNOSIS — Z131 Encounter for screening for diabetes mellitus: Secondary | ICD-10-CM | POA: Diagnosis not present

## 2022-08-27 DIAGNOSIS — E7800 Pure hypercholesterolemia, unspecified: Secondary | ICD-10-CM | POA: Diagnosis not present

## 2022-08-27 DIAGNOSIS — Z0000 Encounter for general adult medical examination without abnormal findings: Secondary | ICD-10-CM | POA: Diagnosis not present

## 2022-08-27 DIAGNOSIS — Z79899 Other long term (current) drug therapy: Secondary | ICD-10-CM | POA: Diagnosis not present

## 2022-08-27 DIAGNOSIS — Z125 Encounter for screening for malignant neoplasm of prostate: Secondary | ICD-10-CM | POA: Diagnosis not present

## 2022-08-27 NOTE — Telephone Encounter (Signed)
Pt c/o of Chest Pain: STAT if CP now or developed within 24 hours  1. Are you having CP right now?   No  2. Are you experiencing any other symptoms (ex. SOB, nausea, vomiting, sweating)? SOB, SWEATING  3. How long have you been experiencing CP?   Started around the end of April  4. Is your CP continuous or coming and going?   Comes and goes  5. Have you taken Nitroglycerin?   When he gets short winded  Patient stated he gets short winded when doing exercises.  Patient stated he has been recently diagnosed with epilepsy.

## 2022-08-27 NOTE — Telephone Encounter (Signed)
Called pt, he states he has been having chest pain coming and going. "It just comes when I'm up moving around. It just comes and goes." Pt states the pain lasts about 5-10 minutes about once or twice a week. IT all started at the end of April. Pt took NTG, it helped after he took 2. Pt's follow up is due in February, pt wants to know if he needs to be seen sooner. Patient encouraged to go to the Emergency Department for worsening symptoms.

## 2022-09-02 ENCOUNTER — Other Ambulatory Visit: Payer: Self-pay

## 2022-09-30 ENCOUNTER — Emergency Department (HOSPITAL_COMMUNITY)
Admission: EM | Admit: 2022-09-30 | Discharge: 2022-09-30 | Disposition: A | Discharge status: Home / Self Care | Payer: Federal, State, Local not specified - PPO | Attending: Emergency Medicine | Admitting: Emergency Medicine

## 2022-09-30 ENCOUNTER — Other Ambulatory Visit: Payer: Self-pay

## 2022-09-30 DIAGNOSIS — Z7982 Long term (current) use of aspirin: Secondary | ICD-10-CM | POA: Insufficient documentation

## 2022-09-30 DIAGNOSIS — M79603 Pain in arm, unspecified: Secondary | ICD-10-CM | POA: Diagnosis not present

## 2022-09-30 DIAGNOSIS — R569 Unspecified convulsions: Secondary | ICD-10-CM | POA: Diagnosis not present

## 2022-09-30 DIAGNOSIS — R000 Tachycardia, unspecified: Secondary | ICD-10-CM | POA: Diagnosis not present

## 2022-09-30 DIAGNOSIS — G4489 Other headache syndrome: Secondary | ICD-10-CM | POA: Diagnosis not present

## 2022-09-30 LAB — RAPID URINE DRUG SCREEN, HOSP PERFORMED
Amphetamines: NOT DETECTED
Barbiturates: NOT DETECTED
Benzodiazepines: NOT DETECTED
Cocaine: NOT DETECTED
Opiates: NOT DETECTED
Tetrahydrocannabinol: NOT DETECTED

## 2022-09-30 LAB — URINALYSIS, ROUTINE W REFLEX MICROSCOPIC
Bilirubin Urine: NEGATIVE
Glucose, UA: NEGATIVE mg/dL
Hgb urine dipstick: NEGATIVE
Ketones, ur: NEGATIVE mg/dL
Leukocytes,Ua: NEGATIVE
Nitrite: NEGATIVE
Protein, ur: NEGATIVE mg/dL
Specific Gravity, Urine: 1.016 (ref 1.005–1.030)
pH: 5 (ref 5.0–8.0)

## 2022-09-30 LAB — COMPREHENSIVE METABOLIC PANEL
ALT: 30 U/L (ref 0–44)
AST: 28 U/L (ref 15–41)
Albumin: 3.7 g/dL (ref 3.5–5.0)
Alkaline Phosphatase: 80 U/L (ref 38–126)
Anion gap: 9 (ref 5–15)
BUN: 10 mg/dL (ref 6–20)
CO2: 19 mmol/L — ABNORMAL LOW (ref 22–32)
Calcium: 8.7 mg/dL — ABNORMAL LOW (ref 8.9–10.3)
Chloride: 110 mmol/L (ref 98–111)
Creatinine, Ser: 0.98 mg/dL (ref 0.61–1.24)
GFR, Estimated: >60 mL/min (ref >60)
Glucose, Bld: 120 mg/dL — ABNORMAL HIGH (ref 70–99)
Potassium: 3.6 mmol/L (ref 3.5–5.1)
Sodium: 138 mmol/L (ref 135–145)
Total Bilirubin: 0.7 mg/dL (ref 0.3–1.2)
Total Protein: 6.1 g/dL — ABNORMAL LOW (ref 6.5–8.1)

## 2022-09-30 LAB — D-DIMER, QUANTITATIVE: D-Dimer, Quant: <0.27 ug/mL-FEU (ref 0.00–0.50)

## 2022-09-30 LAB — CBC WITH DIFFERENTIAL/PLATELET
Abs Immature Granulocytes: 0.01 10*3/uL (ref 0.00–0.07)
Basophils Absolute: 0 10*3/uL (ref 0.0–0.1)
Basophils Relative: 0 %
Eosinophils Absolute: 0.1 10*3/uL (ref 0.0–0.5)
Eosinophils Relative: 2 %
HCT: 43.9 % (ref 39.0–52.0)
Hemoglobin: 14.5 g/dL (ref 13.0–17.0)
Immature Granulocytes: 0 %
Lymphocytes Relative: 33 %
Lymphs Abs: 1.6 10*3/uL (ref 0.7–4.0)
MCH: 31 pg (ref 26.0–34.0)
MCHC: 33 g/dL (ref 30.0–36.0)
MCV: 93.8 fL (ref 80.0–100.0)
Monocytes Absolute: 0.4 10*3/uL (ref 0.1–1.0)
Monocytes Relative: 9 %
Neutro Abs: 2.8 10*3/uL (ref 1.7–7.7)
Neutrophils Relative %: 56 %
Platelets: 218 10*3/uL (ref 150–400)
RBC: 4.68 MIL/uL (ref 4.22–5.81)
RDW: 13.3 % (ref 11.5–15.5)
WBC: 5 10*3/uL (ref 4.0–10.5)
nRBC: 0 % (ref 0.0–0.2)

## 2022-09-30 LAB — MAGNESIUM: Magnesium: 1.9 mg/dL (ref 1.7–2.4)

## 2022-09-30 LAB — ETHANOL: Alcohol, Ethyl (B): <10 mg/dL (ref <10)

## 2022-09-30 LAB — CBG MONITORING, ED: Glucose-Capillary: 111 mg/dL — ABNORMAL HIGH (ref 70–99)

## 2022-09-30 MED ORDER — LORAZEPAM 1 MG PO TABS
1.0000 mg | ORAL_TABLET | ORAL | Status: AC
  Administered 2022-09-30: 1 mg via ORAL
  Filled 2022-09-30: qty 1

## 2022-09-30 MED ORDER — LEVETIRACETAM IN NACL 1000 MG/100ML IV SOLN
1000.0000 mg | Freq: Once | INTRAVENOUS | Status: AC
  Administered 2022-09-30: 1000 mg via INTRAVENOUS
  Filled 2022-09-30: qty 100

## 2022-09-30 MED ORDER — LEVETIRACETAM 1000 MG PO TABS: 1000.0000 mg | ORAL_TABLET | Freq: Two times a day (BID) | ORAL | 2 refills | Status: DC

## 2022-09-30 NOTE — ED Provider Notes (Signed)
[place] EMERGENCY DEPARTMENT AT [facility] Provider Note   CSN: [id number] Arrival date & time: 09/30/22  1123     History  Chief Complaint  Patient presents with   Seizures    Pt experienced a witnessed seizure, and does have history of such. Pt up to date with medications. Hx of TBI and blood clots. Pt currently complains of HA and left arm pain.    Antonio Smith is a 47 y.o. male.  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  Patient presents in federal agent custody and reports that they were interviewing him with his lawyer when he started to become very stressed and then started having clenching of his left upper extremity that was followed by 5 minutes of tonic seizure.  Says his eyes were closed during this event.  No tongue bite or bowel or bladder incontinence.  Immediately after the seizure was talking to them asking him where they were and who they were.  Last seizure was a week ago.  No recent illnesses.  Father says he is worried because his son may have been complaining of chest pain and shortness of breath and left arm pain that started after the seizure today.  Denies any heavy alcohol use or recreational drug use.  No additional head trauma.       Home Medications Prior to Admission medications   Medication Sig Start Date End Date Taking? Authorizing Provider  levETIRAcetam (KEPPRA) 1000 MG tablet Take 1 tablet (1,000 mg total) by mouth 2 (two) times daily. 09/30/22  Yes Rondel Baton, MD  albuterol (VENTOLIN HFA) 108 (90 Base) MCG/ACT inhaler Inhale 1 puff into the lungs every 4 (four) hours. 10/23/21   [provider]  aspirin EC 81 MG tablet Take 1 tablet (81 mg total) by mouth daily. Swallow whole. 02/27/22   Maisie Fus, MD  atorvastatin (LIPITOR) 20 MG tablet Take 20 mg by mouth daily.    [provider]   fluticasone-salmeterol (ADVAIR) 500-50 MCG/ACT AEPB Inhale 1 puff into the lungs in the morning and at bedtime. 12/25/21   Hunsucker, Lesia Sago, MD  metoprolol succinate (TOPROL-XL) 25 MG 24 hr tablet Take 0.5 tablets (12.5 mg total) by mouth daily. Take with or immediately following a meal. 02/27/22   Maisie Fus, MD  nitroGLYCERIN (NITROSTAT) 0.4 MG SL tablet Place 1 tablet (0.4 mg total) under the tongue every 5 (five) minutes as needed for chest pain. 08/01/22   Maisie Fus, MD  varenicline (CHANTIX) 1 MG tablet 1/2 pill once day for 3 days, then 1/2 pill twice a day for 4 days, then 1 pill twice a day thereafter Orally Once a day for 30 days 08/27/22   [provider]      Allergies    Prednisone    Review of Systems   Review of Systems  Physical Exam Updated Vital Signs BP 136/83 (BP Location: Right Arm)   Pulse 81   Temp (!) 97.5 F (36.4 C) (Oral)   Resp 17   Ht 5\' 10"  (1.778 m)   Wt 113.4 kg   SpO2 98%   BMI 35.87 kg/m  Physical Exam Vitals and nursing note reviewed.  Constitutional:      General: He is not in acute distress.    Appearance: He is well-developed.     Comments: Alert and oriented x 3.  Conversant.  HENT:     Head: Normocephalic and atraumatic.     Right Ear: External ear normal.     Left Ear: External ear normal.     Nose: Nose normal.  Eyes:     Extraocular Movements: Extraocular movements intact.     Conjunctiva/sclera: Conjunctivae normal.     Pupils: Pupils are equal, round, and reactive to light.  Cardiovascular:     Rate and Rhythm: Normal rate and regular rhythm.     Heart sounds: Normal heart sounds.  Pulmonary:     Effort: Pulmonary effort is normal. No respiratory distress.     Breath sounds: Normal breath sounds.  Musculoskeletal:     Cervical back: Normal range of motion and neck supple.  Skin:    General: Skin is warm and dry.  Neurological:     Mental Status: He is alert and oriented to person, place, and time. Mental  status is at baseline.     Cranial Nerves: No cranial nerve deficit.     Sensory: No sensory deficit.     Motor: No weakness.  Psychiatric:        Mood and Affect: Mood normal.        Behavior: Behavior normal.     ED Results / Procedures / Treatments   Labs (all labs ordered are listed, but only abnormal results are displayed) Labs Reviewed  COMPREHENSIVE METABOLIC PANEL - Abnormal; Notable for the following components:      Result Value   CO2 19 (*)    Glucose, Bld 120 (*)    Calcium 8.7 (*)    Total Protein 6.1 (*)    All other components within normal limits  CBG MONITORING, ED - Abnormal; Notable for the following components:   Glucose-Capillary 111 (*)    All other components within normal limits  CBC WITH DIFFERENTIAL/PLATELET  MAGNESIUM  RAPID URINE DRUG SCREEN, HOSP PERFORMED  URINALYSIS, ROUTINE W REFLEX MICROSCOPIC  ETHANOL  D-DIMER, QUANTITATIVE    EKG None  Radiology No results found.  Procedures Procedures    Medications Ordered in ED Medications  LORazepam (ATIVAN) tablet 1 mg (1 mg Oral Given 09/30/22 1148)  levETIRAcetam (KEPPRA) IVPB 1000 mg/100 mL premix (0 mg Intravenous Stopped 09/30/22 1207)    ED Course/ Medical Decision Making/ A&P                                  Medical Decision Making Amount and/or Complexity of Data Reviewed Labs: ordered.  Risk Prescription drug management.   Antonio Smith is a 47 y.o. 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  Does not appear to have had a long postictal period and did have his eyes closed and not have any bowel or bladder incontinence during this event.  He says that it was  brought on by stress.  Patient cannot tell me what antiepileptics he is on but says that he has been taking all of his prescribed medications.  Appears that he has had compliance issues in the past after talking to his primary neurologist Dr. Teresa Coombs.  He recommends starting him on Keppra 1000 mg twice daily at this time and  having him follow-up in clinic.  Upon re-evaluation patient continued to improve.  Given his history of seizures and reassuring neurologic exam do not feel that head imaging is necessarily warranted at this time.  Will have him follow-up with his neurologist in 1 to 2 weeks regarding his symptoms.  Return precautions discussed with the patient prior to discharge.  Of note, patient's father was also requesting that blood work be sent to evaluate for DVT since he is off to his anticoagulation and was concerned about his arm movements during the seizure which sometimes have happened when he has had blood clots in the past.  His D-dimer was undetectably low so feel this is highly unlikely.  This patient presents to the ED for concern of complaints listed in HPI, this involves an extensive number of treatment options, and is a complaint that carries with it a high risk of complications and morbidity. Disposition including potential need for admission considered.   Dispo: DC in federal agent custody  Additional history obtained from father Records reviewed Outpatient Clinic Notes The following labs were independently interpreted: Chemistry and show no acute abnormality I personally reviewed and interpreted cardiac monitoring: normal sinus rhythm  I have reviewed the patients home medications and made adjustments as needed Consults: Neurology Social Determinants of health:  In federal agent custody         Final Clinical Impression(s) / ED Diagnoses Final diagnoses:  Seizure [facility])    Rx / DC Orders ED Discharge Orders          Ordered    levETIRAcetam (KEPPRA) 1000 MG  tablet  2 times daily        09/30/22 1508              Rondel Baton, MD 09/30/22 1929

## 2022-09-30 NOTE — Discharge Instructions (Signed)
You were seen for seizure in the emergency department.   At home, please take the 1,000 mg of Keppra twice a day that was prescribed to you by your neurologist.    Check your MyChart online for the results of any tests that had not resulted by the time you left the emergency department.   Follow-up with your neurologist in the next 1 to 2 weeks.  Return immediately to the emergency department if you experience any of the following: Seizures lasting more than 5 minutes, back-to-back seizures, or any other concerning symptoms.    Thank you for visiting our Emergency Department. It was a pleasure taking care of you today.

## 2022-10-09 ENCOUNTER — Encounter: Payer: Self-pay | Admitting: Neurology

## 2022-10-09 ENCOUNTER — Ambulatory Visit: Payer: Federal, State, Local not specified - PPO | Admitting: Neurology

## 2022-10-09 VITALS — BP 132/78 | HR 87 | Ht 71.0 in | Wt 245.5 lb

## 2022-10-09 DIAGNOSIS — G40219 Localization-related (focal) (partial) symptomatic epilepsy and epileptic syndromes with complex partial seizures, intractable, without status epilepticus: Secondary | ICD-10-CM | POA: Diagnosis not present

## 2022-10-09 DIAGNOSIS — S069X9S Unspecified intracranial injury with loss of consciousness of unspecified duration, sequela: Secondary | ICD-10-CM

## 2022-10-09 MED ORDER — CLOBAZAM 10 MG PO TABS: 10.0000 mg | ORAL_TABLET | Freq: Every evening | ORAL | 5 refills | Status: DC

## 2022-10-09 NOTE — Patient Instructions (Signed)
Discontinue Keppra  Start Clobazam 10 mg nightly  Continue your other medications  Continue to follow up with PCP  Return in 6 months or sooner if worse
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
## 2022-10-21 ENCOUNTER — Ambulatory Visit: Payer: Federal, State, Local not specified - PPO | Admitting: Neurology

## 2022-10-21 ENCOUNTER — Encounter: Payer: Self-pay | Admitting: Neurology

## 2022-10-21 VITALS — BP 115/77 | HR 93 | Ht 70.0 in | Wt 245.0 lb

## 2022-10-21 DIAGNOSIS — S069X9S Unspecified intracranial injury with loss of consciousness of unspecified duration, sequela: Secondary | ICD-10-CM | POA: Diagnosis not present

## 2022-10-21 DIAGNOSIS — G40219 Localization-related (focal) (partial) symptomatic epilepsy and epileptic syndromes with complex partial seizures, intractable, without status epilepticus: Secondary | ICD-10-CM | POA: Diagnosis not present

## 2022-10-21 NOTE — Patient Instructions (Signed)
Continue with Clobazam 10 mg nightly  Continue with your other medications  Continue to follow up with PCP  Return in 6 months or sooner if worse
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
## 2022-10-23 ENCOUNTER — Encounter: Payer: Self-pay | Admitting: Neurology

## 2022-10-23 ENCOUNTER — Telehealth: Payer: Self-pay | Admitting: Neurology

## 2022-10-23 ENCOUNTER — Other Ambulatory Visit: Payer: Self-pay | Admitting: Neurology

## 2022-10-23 DIAGNOSIS — R4189 Other symptoms and signs involving cognitive functions and awareness: Secondary | ICD-10-CM

## 2022-10-23 DIAGNOSIS — S069X9S Unspecified intracranial injury with loss of consciousness of unspecified duration, sequela: Secondary | ICD-10-CM

## 2022-10-23 NOTE — Telephone Encounter (Signed)
Phone room,  Please tell them letter will be completed today per Dr. Teresa Coombs.  Thanks,  Production assistant, radio

## 2022-10-23 NOTE — Telephone Encounter (Signed)
Father, Mr. Dockstader (not on DPR) requesting a letter to explain pt's condition.  Informed Mr. Rutkowski he is not on pt's DPR and would not be able to share patient health information. Mr. Stadtlander (father) said would have his son call back to request the letter.

## 2022-10-23 NOTE — Telephone Encounter (Signed)
Letter will be completed today. I saw them Tuesday and agreed to write them a letter.

## 2022-10-28 ENCOUNTER — Telehealth: Payer: Self-pay | Admitting: Neurology

## 2022-10-28 NOTE — Telephone Encounter (Signed)
Referral for neuropsychology fax to [facility]. Phone:[phone], Fax: [phone]

## 2022-11-04 ENCOUNTER — Telehealth: Payer: Self-pay | Admitting: Neurology

## 2022-11-04 NOTE — Telephone Encounter (Signed)
Call to patients mom and she states patient is alert and oriented and completes all ADL's. She states that stress can bring on seizures. Advised I will send to Dr. Teresa Coombs but due to orientation status, it is likely that Dr. Garnet Koyanagi will not agree to send letter stating he is not capable of making his own decisions. Mother in agreement and understands I will send My chart message back confirming.

## 2022-11-04 NOTE — Telephone Encounter (Signed)
Pt's dad is asking for a letter to be created addressing that pt is not stable at this time to make decisions.  Father states when pt gets upset he has a seizure and it takes a lot out of pt and this causes him not to remember things, please call father to discuss further. Father states this is time sensitive, pt has court on Thursday and this is a letter they really need to get by tomorrow.

## 2022-11-05 NOTE — Telephone Encounter (Signed)
Agree. I won't be able to write them a letter stating that he is unable to make decisions for himself. I did refer them for a formal neuropsychological testing to further address his cognitive issues.   Dr. Teresa Coombs

## 2023-01-04 IMAGING — CR DG CHEST 2V
2 series · 2 of 2 positions shown · non-contrast
Comparison: Chest x-ray 10/31/2016

CLINICAL DATA: Shortness of breath

EXAM:
CHEST - 2 VIEW

[w chest pa]
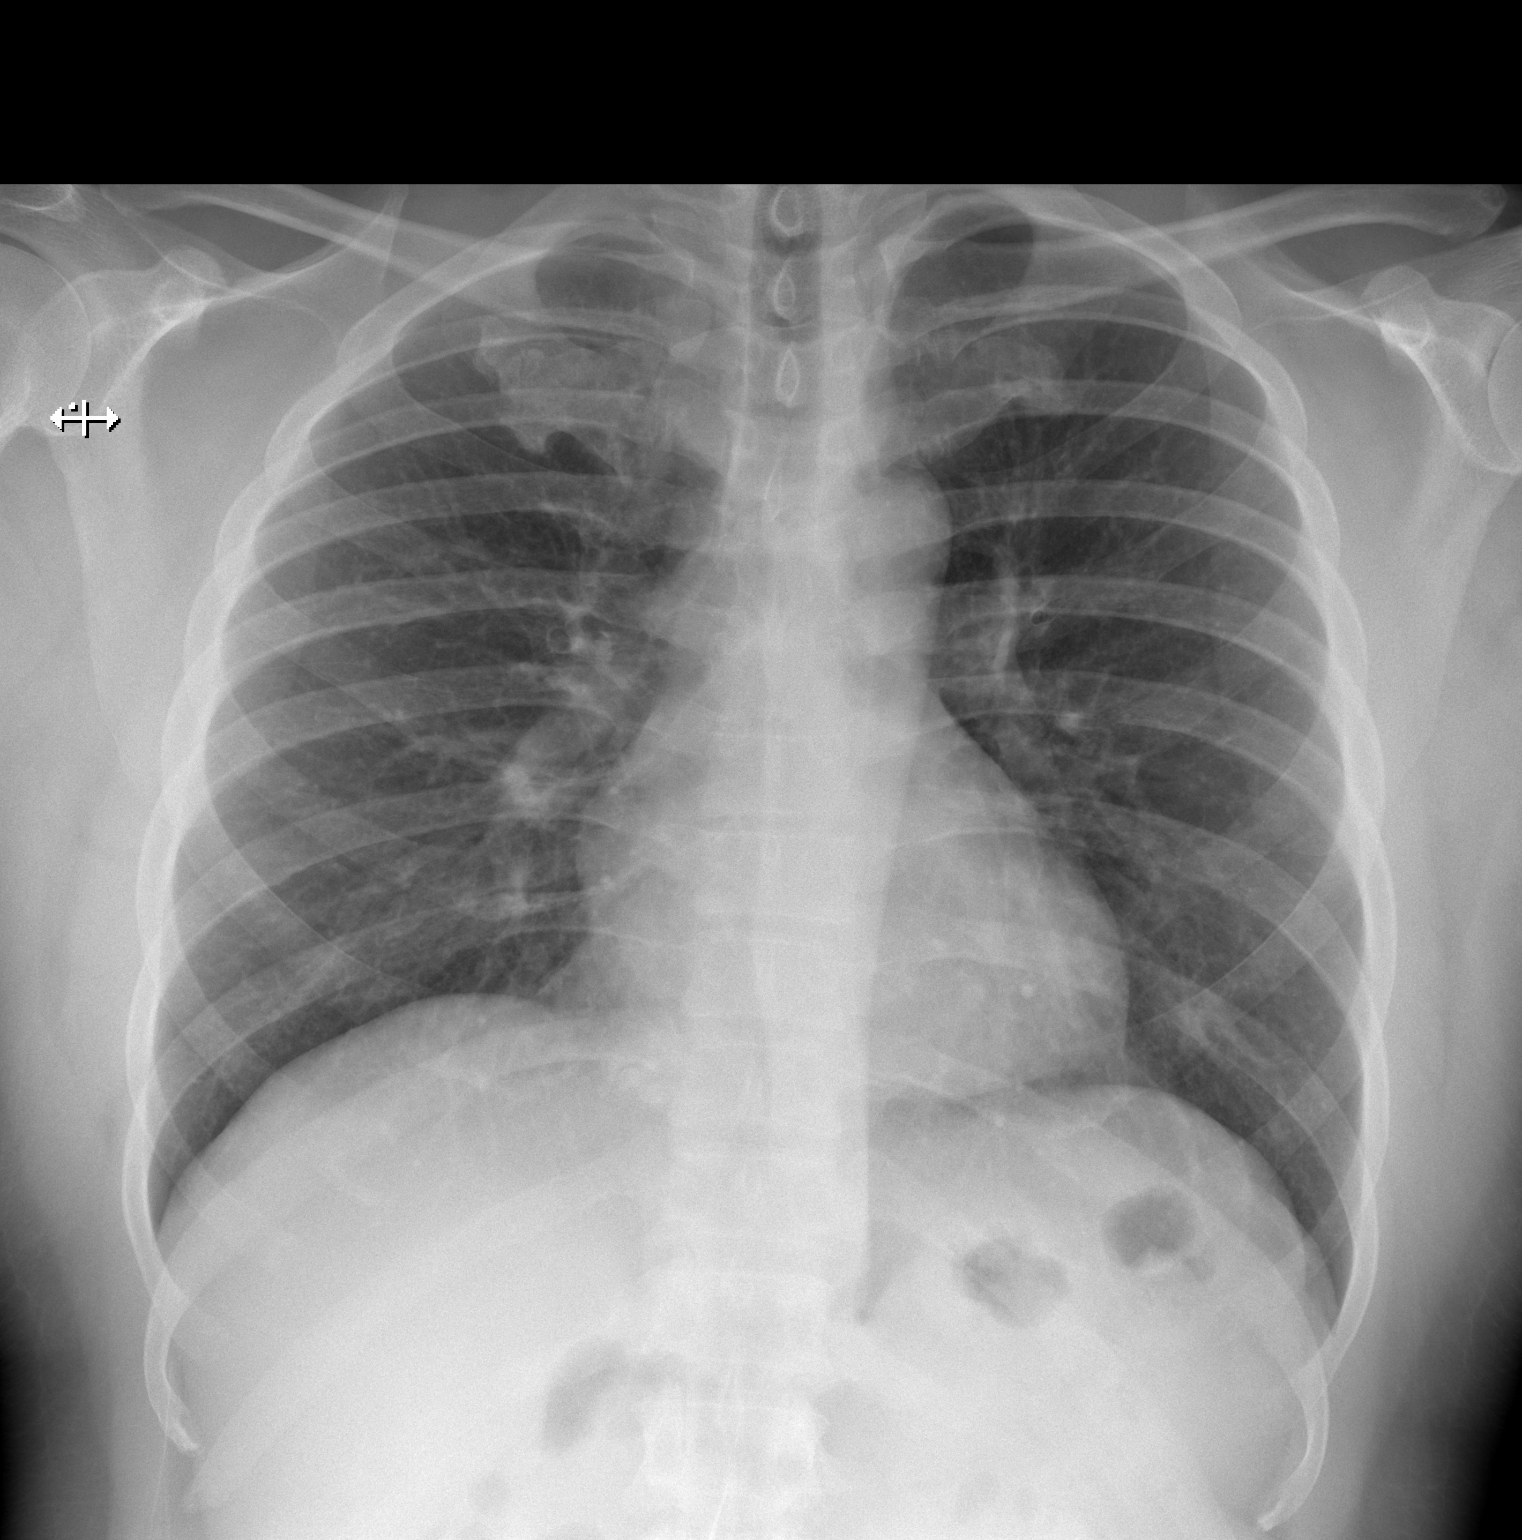

[w chest lat]
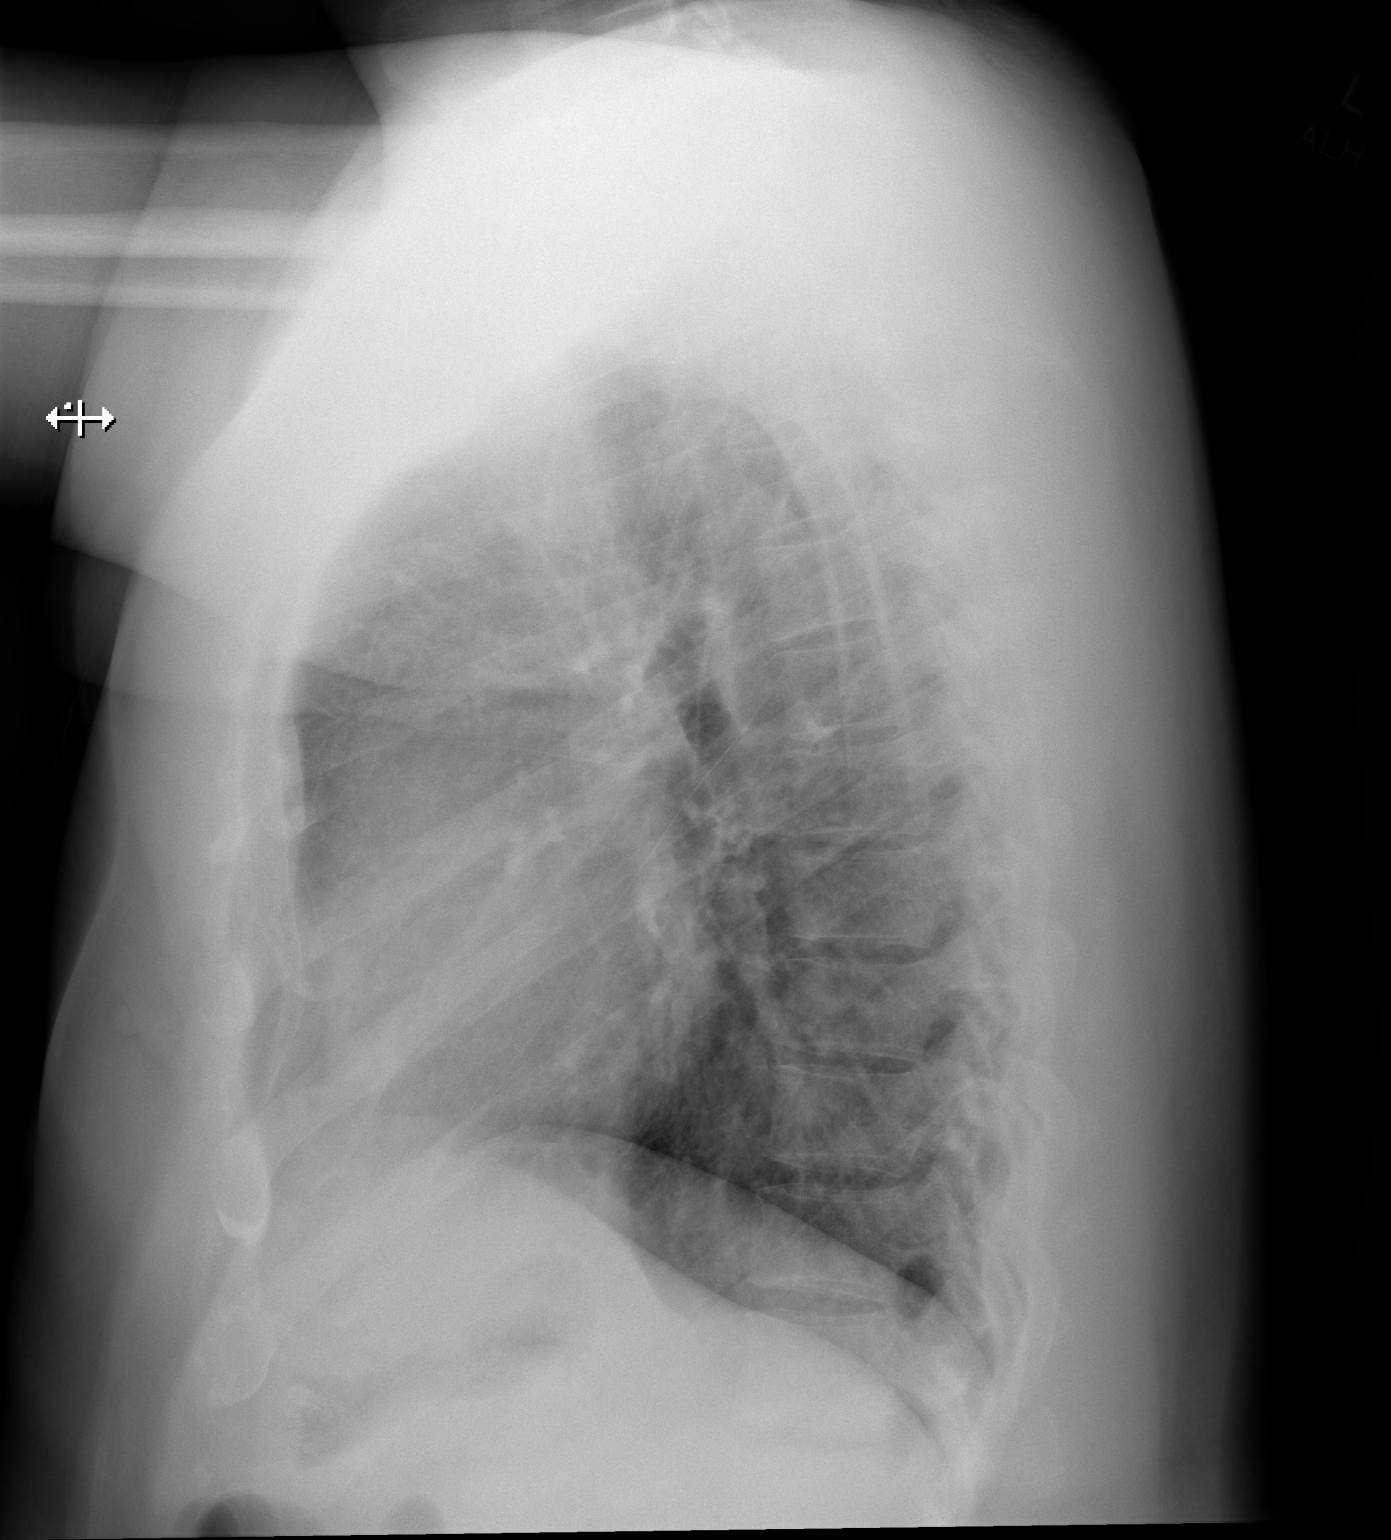

[2 of 2 positions shown; findings below may reference images not displayed]

FINDINGS: Heart size and mediastinal contours are within normal limits. No
suspicious pulmonary opacities identified.

No pleural effusion or pneumothorax visualized.

No acute osseous abnormality appreciated.
IMPRESSION: No acute intrathoracic process identified.

## 2023-01-29 ENCOUNTER — Emergency Department (HOSPITAL_COMMUNITY)
Admission: EM | Admit: 2023-01-29 | Discharge: 2023-01-29 | Disposition: A | Discharge status: Home / Self Care | Payer: Federal, State, Local not specified - PPO | Attending: Emergency Medicine | Admitting: Emergency Medicine

## 2023-01-29 ENCOUNTER — Encounter (HOSPITAL_COMMUNITY): Payer: Self-pay

## 2023-01-29 DIAGNOSIS — R9431 Abnormal electrocardiogram [ECG] [EKG]: Secondary | ICD-10-CM | POA: Diagnosis not present

## 2023-01-29 DIAGNOSIS — R258 Other abnormal involuntary movements: Secondary | ICD-10-CM | POA: Diagnosis not present

## 2023-01-29 DIAGNOSIS — I10 Essential (primary) hypertension: Secondary | ICD-10-CM | POA: Diagnosis not present

## 2023-01-29 DIAGNOSIS — R569 Unspecified convulsions: Secondary | ICD-10-CM

## 2023-01-29 DIAGNOSIS — Z7982 Long term (current) use of aspirin: Secondary | ICD-10-CM | POA: Insufficient documentation

## 2023-01-29 LAB — CBC
HCT: 48.3 % (ref 39.0–52.0)
Hemoglobin: 15.9 g/dL (ref 13.0–17.0)
MCH: 30.3 pg (ref 26.0–34.0)
MCHC: 32.9 g/dL (ref 30.0–36.0)
MCV: 92.2 fL (ref 80.0–100.0)
Platelets: 251 10*3/uL (ref 150–400)
RBC: 5.24 MIL/uL (ref 4.22–5.81)
RDW: 13.2 % (ref 11.5–15.5)
WBC: 5.3 10*3/uL (ref 4.0–10.5)
nRBC: 0 % (ref 0.0–0.2)

## 2023-01-29 LAB — BASIC METABOLIC PANEL
Anion gap: 7 (ref 5–15)
BUN: 17 mg/dL (ref 6–20)
CO2: 22 mmol/L (ref 22–32)
Calcium: 9.1 mg/dL (ref 8.9–10.3)
Chloride: 111 mmol/L (ref 98–111)
Creatinine, Ser: 0.88 mg/dL (ref 0.61–1.24)
GFR, Estimated: >60 mL/min (ref >60)
Glucose, Bld: 77 mg/dL (ref 70–99)
Potassium: 4 mmol/L (ref 3.5–5.1)
Sodium: 140 mmol/L (ref 135–145)

## 2023-01-29 LAB — CBG MONITORING, ED: Glucose-Capillary: 77 mg/dL (ref 70–99)

## 2023-01-29 MED ORDER — CLONAZEPAM 0.5 MG PO TABS: 0.5000 mg | ORAL_TABLET | Freq: Every day | ORAL | 0 refills | Status: AC | PRN

## 2023-01-29 MED ORDER — CLOBAZAM 10 MG PO TABS
10.0000 mg | ORAL_TABLET | Freq: Once | ORAL | Status: AC
  Administered 2023-01-29: 10 mg via ORAL
  Filled 2023-01-29: qty 1

## 2023-01-29 NOTE — ED Triage Notes (Addendum)
Pt arrives via ems to the er for the c/o seizure. Per ems, pt was at court to be sentenced and had seizure like activity. Ems states had same activity with them and they are not convinced of an actual seizure.  Hx TBI and seizure, pt states he is on seizure meds. BG 77. VSS per ems. GCS 15. Not postictal on arrival, speech at baseline per ems.

## 2023-01-29 NOTE — Discharge Instructions (Signed)
Your labs that were done here today were normal.  Please keep taking your clobazam each night as prescribed.  Please call your neurologist tomorrow to set up a follow-up appointment.  I have also sent you a prescription for medication called Klonopin.  You can take this 30 minutes before planned stressful situations.

## 2023-01-29 NOTE — ED Provider Notes (Signed)
[place] EMERGENCY DEPARTMENT AT [facility] Provider Note   CSN: [id number] Arrival date & time: 01/29/23  1544     History  Chief Complaint  Patient presents with   Seizures    Antonio Smith is a 47 y.o. male.  This is a 47 year old male presents emergency department today after a possible seizure that occurred while the patient was in court.  Patient has a history of seizure following a TBI.  He takes clobazam.  Endorses being compliant with his medications.   Seizures      Home Medications Prior to Admission medications   Medication Sig Start Date End Date Taking? Authorizing Provider  clonazePAM (KLONOPIN) 0.5 MG tablet Take 1 tablet (0.5 mg total) by mouth daily as needed for anxiety. 01/29/23  Yes Anders Simmonds T, DO  albuterol (VENTOLIN HFA) 108 (90 Base) MCG/ACT inhaler Inhale 1 puff into the lungs every 4 (four) hours. 10/23/21   [provider]  aspirin EC 81 MG tablet Take 1 tablet (81 mg total) by mouth daily. Swallow whole. 02/27/22   Maisie Fus, MD  atorvastatin (LIPITOR) 20 MG tablet Take 20 mg by mouth daily.    [provider]  cloBAZam (ONFI) 10 MG tablet Take 1 tablet (10 mg total) by mouth at bedtime. 10/09/22 04/07/23  Windell Norfolk, MD  fluticasone-salmeterol (ADVAIR) 500-50 MCG/ACT AEPB Inhale 1 puff into the lungs in the morning and at bedtime. 12/25/21   Hunsucker, Lesia Sago, MD  metoprolol succinate (TOPROL-XL) 25 MG 24 hr tablet Take 0.5 tablets (12.5 mg total) by mouth daily. Take with or immediately following a meal. 02/27/22   Maisie Fus, MD  nitroGLYCERIN (NITROSTAT) 0.4 MG SL tablet Place 1 tablet (0.4 mg total) under the tongue every 5 (five) minutes as needed for chest pain. 08/01/22   Maisie Fus, MD  varenicline (CHANTIX) 1 MG tablet 1/2 pill once day for 3 days, then 1/2 pill twice a day for 4 days, then 1 pill twice a day thereafter Orally Once a day for 30 days 08/27/22   [provider]       Allergies    Prednisone    Review of Systems   Review of Systems  Neurological:  Positive for seizures.    Physical Exam Updated Vital Signs BP 128/86 (BP Location: Right Arm)   Pulse 90   Temp 98.5 F (36.9 C) (Oral)   Resp 16   SpO2 99%  Physical Exam Vitals reviewed.  Constitutional:      Appearance: Normal appearance.  HENT:     Head: Normocephalic.  Cardiovascular:     Rate and Rhythm: Normal rate.  Musculoskeletal:        General: Normal range of motion.     Cervical back: Normal range of motion.  Neurological:     General: No focal deficit present.     Mental Status: He is alert.     ED Results / Procedures / Treatments   Labs (all labs ordered are listed, but only abnormal results are displayed) Labs Reviewed  BASIC METABOLIC PANEL  CBC  CBG MONITORING, ED  CBG MONITORING, ED    EKG None  Radiology No results found.  Procedures Procedures    Medications Ordered in ED Medications  cloBAZam (ONFI) tablet 10 mg (has no administration in time range)    ED Course/ Medical Decision Making/ A&P  Medical Decision Making 47 year old male here today after a possible seizure.  Plan it sounds as though the patient seems to have the seizures brought on by stressful situations.  He has been compliant with his clobazam.  I spoke with neurology, Dr. Derry Lory, who recommended prescribing the patient short course of clonazepam to use as needed for stressful situations.  Reviewed the patient's labs, normal electrolytes, normal renal function.  Patient is at baseline, do not see an indication for imaging of the patient's head.  Will discharge patient at this time.  Amount and/or Complexity of Data Reviewed Labs: ordered.  Risk Prescription drug management.           Final Clinical Impression(s) / ED Diagnoses Final diagnoses:  Seizure-like activity (HCC)    Rx / DC Orders ED Discharge Orders           Ordered    clonazePAM (KLONOPIN) 0.5 MG tablet  Daily PRN        01/29/23 1708              Anders Simmonds T, DO 01/29/23 1709

## 2023-03-03 ENCOUNTER — Other Ambulatory Visit (HOSPITAL_COMMUNITY): Payer: Self-pay

## 2023-03-03 ENCOUNTER — Other Ambulatory Visit: Payer: Self-pay

## 2023-03-04 ENCOUNTER — Other Ambulatory Visit (HOSPITAL_BASED_OUTPATIENT_CLINIC_OR_DEPARTMENT_OTHER): Payer: Self-pay

## 2023-03-06 ENCOUNTER — Other Ambulatory Visit: Payer: Self-pay

## 2023-03-11 DIAGNOSIS — Z0271 Encounter for disability determination: Secondary | ICD-10-CM

## 2023-03-19 ENCOUNTER — Other Ambulatory Visit: Payer: Self-pay | Admitting: Internal Medicine

## 2023-04-21 ENCOUNTER — Ambulatory Visit: Payer: Federal, State, Local not specified - PPO | Admitting: Neurology

## 2023-04-21 ENCOUNTER — Other Ambulatory Visit: Payer: Self-pay | Admitting: Neurology

## 2023-04-21 ENCOUNTER — Telehealth: Payer: Self-pay

## 2023-04-21 ENCOUNTER — Encounter: Payer: Self-pay | Admitting: Neurology

## 2023-04-21 ENCOUNTER — Other Ambulatory Visit (HOSPITAL_COMMUNITY): Payer: Self-pay

## 2023-04-21 VITALS — BP 128/88 | HR 81 | Ht 70.0 in | Wt 253.0 lb

## 2023-04-21 DIAGNOSIS — R4189 Other symptoms and signs involving cognitive functions and awareness: Secondary | ICD-10-CM | POA: Diagnosis not present

## 2023-04-21 DIAGNOSIS — S069X9S Unspecified intracranial injury with loss of consciousness of unspecified duration, sequela: Secondary | ICD-10-CM

## 2023-04-21 DIAGNOSIS — G40219 Localization-related (focal) (partial) symptomatic epilepsy and epileptic syndromes with complex partial seizures, intractable, without status epilepticus: Secondary | ICD-10-CM
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
## 2023-04-21 NOTE — Telephone Encounter (Signed)
 Pattricia Boss,  Can you followup on neuropsych referral? Thanks,  Iris Pert

## 2023-04-21 NOTE — Patient Instructions (Addendum)
 Increase Clobazam to 10 mg twice daily  Continue current medications  Will follow up on formal neuropsychological testing  Return in 6 months or sooner if worse

## 2023-04-22 MED ORDER — CLOBAZAM 10 MG PO TABS: 10.0000 mg | ORAL_TABLET | Freq: Two times a day (BID) | ORAL | 5 refills | Status: AC

## 2023-04-22 NOTE — Telephone Encounter (Signed)
 Pt Last Seen 04/21/2023 Upcoming Appointment 10/21/2023  In your Office Note from [place], you stated that:  "Increase Clobazam to 10mg  2x Daily" I have went Through Pt's Medication and no script was sent in for pt.

## 2023-04-29 NOTE — Telephone Encounter (Signed)
 [facility] [phone]. Referral Coordinator, Alcario Drought said has attempted to contact patient numerous of times. Informed her I will call the patient to have him to call to schedule appointment.  Called patient,no answer, left voicemail to call [facility] to regards to referral.

## 2023-04-29 NOTE — Telephone Encounter (Signed)
 Called and spoke to pt father and stated: Please contact [facility] [phone]. Referral Coordinator, Alcario Drought

## 2023-04-29 NOTE — Telephone Encounter (Signed)
 Please call patient father and patient is known not to pick up his phone

## 2023-04-30 NOTE — Telephone Encounter (Signed)
 Thanks

## 2023-05-04 ENCOUNTER — Other Ambulatory Visit (HOSPITAL_COMMUNITY): Payer: Self-pay

## 2023-05-14 DIAGNOSIS — F332 Major depressive disorder, recurrent severe without psychotic features: Secondary | ICD-10-CM | POA: Diagnosis not present

## 2023-05-14 DIAGNOSIS — F411 Generalized anxiety disorder: Secondary | ICD-10-CM | POA: Diagnosis not present

## 2023-05-14 DIAGNOSIS — R413 Other amnesia: Secondary | ICD-10-CM | POA: Diagnosis not present

## 2023-05-14 DIAGNOSIS — R4189 Other symptoms and signs involving cognitive functions and awareness: Secondary | ICD-10-CM | POA: Diagnosis not present

## 2023-06-13 ENCOUNTER — Other Ambulatory Visit: Payer: Self-pay | Admitting: Internal Medicine

## 2023-06-15 ENCOUNTER — Other Ambulatory Visit: Payer: Self-pay | Admitting: Internal Medicine

## 2023-06-15 MED ORDER — ATORVASTATIN CALCIUM 20 MG PO TABS: 20.0000 mg | ORAL_TABLET | Freq: Every day | ORAL | 0 refills | Status: AC

## 2023-07-15 ENCOUNTER — Emergency Department (HOSPITAL_BASED_OUTPATIENT_CLINIC_OR_DEPARTMENT_OTHER)

## 2023-07-15 ENCOUNTER — Emergency Department (HOSPITAL_COMMUNITY)

## 2023-07-15 ENCOUNTER — Emergency Department (HOSPITAL_BASED_OUTPATIENT_CLINIC_OR_DEPARTMENT_OTHER)
Admission: EM | Admit: 2023-07-15 | Discharge: 2023-07-15 | Disposition: A | Discharge status: Home / Self Care | Attending: Emergency Medicine | Admitting: Emergency Medicine

## 2023-07-15 DIAGNOSIS — Z853 Personal history of malignant neoplasm of breast: Secondary | ICD-10-CM | POA: Insufficient documentation

## 2023-07-15 DIAGNOSIS — R202 Paresthesia of skin: Secondary | ICD-10-CM | POA: Diagnosis not present

## 2023-07-15 DIAGNOSIS — R531 Weakness: Secondary | ICD-10-CM | POA: Diagnosis not present

## 2023-07-15 DIAGNOSIS — R29898 Other symptoms and signs involving the musculoskeletal system: Secondary | ICD-10-CM

## 2023-07-15 DIAGNOSIS — I2510 Atherosclerotic heart disease of native coronary artery without angina pectoris: Secondary | ICD-10-CM | POA: Insufficient documentation

## 2023-07-15 DIAGNOSIS — Z7982 Long term (current) use of aspirin: Secondary | ICD-10-CM | POA: Insufficient documentation

## 2023-07-15 DIAGNOSIS — I6529 Occlusion and stenosis of unspecified carotid artery: Secondary | ICD-10-CM | POA: Diagnosis not present

## 2023-07-15 DIAGNOSIS — G9389 Other specified disorders of brain: Secondary | ICD-10-CM | POA: Diagnosis not present

## 2023-07-15 DIAGNOSIS — R55 Syncope and collapse: Secondary | ICD-10-CM | POA: Diagnosis not present

## 2023-07-15 DIAGNOSIS — H547 Unspecified visual loss: Secondary | ICD-10-CM | POA: Diagnosis not present

## 2023-07-15 DIAGNOSIS — M6281 Muscle weakness (generalized): Secondary | ICD-10-CM | POA: Diagnosis not present

## 2023-07-15 DIAGNOSIS — H53123 Transient visual loss, bilateral: Secondary | ICD-10-CM | POA: Diagnosis not present

## 2023-07-15 DIAGNOSIS — R29818 Other symptoms and signs involving the nervous system: Secondary | ICD-10-CM | POA: Diagnosis not present

## 2023-07-15 DIAGNOSIS — I10 Essential (primary) hypertension: Secondary | ICD-10-CM | POA: Diagnosis not present

## 2023-07-15 LAB — CBC WITH DIFFERENTIAL/PLATELET
Abs Immature Granulocytes: 0.01 10*3/uL (ref 0.00–0.07)
Basophils Absolute: 0 10*3/uL (ref 0.0–0.1)
Basophils Relative: 0 %
Eosinophils Absolute: 0.4 10*3/uL (ref 0.0–0.5)
Eosinophils Relative: 7 %
HCT: 45.3 % (ref 39.0–52.0)
Hemoglobin: 15.1 g/dL (ref 13.0–17.0)
Immature Granulocytes: 0 %
Lymphocytes Relative: 46 %
Lymphs Abs: 2.6 10*3/uL (ref 0.7–4.0)
MCH: 30.8 pg (ref 26.0–34.0)
MCHC: 33.3 g/dL (ref 30.0–36.0)
MCV: 92.4 fL (ref 80.0–100.0)
Monocytes Absolute: 0.4 10*3/uL (ref 0.1–1.0)
Monocytes Relative: 8 %
Neutro Abs: 2.2 10*3/uL (ref 1.7–7.7)
Neutrophils Relative %: 39 %
Platelets: 235 10*3/uL (ref 150–400)
RBC: 4.9 MIL/uL (ref 4.22–5.81)
RDW: 12.6 % (ref 11.5–15.5)
WBC: 5.7 10*3/uL (ref 4.0–10.5)
nRBC: 0 % (ref 0.0–0.2)

## 2023-07-15 LAB — URINE DRUG SCREEN
Amphetamines: NOT DETECTED
Barbiturates: NOT DETECTED
Benzodiazepines: NOT DETECTED
Cocaine: NOT DETECTED
Fentanyl: NOT DETECTED
Methadone Scn, Ur: NOT DETECTED
Opiates: NOT DETECTED
Tetrahydrocannabinol: NOT DETECTED

## 2023-07-15 LAB — URINALYSIS, ROUTINE W REFLEX MICROSCOPIC
Bilirubin Urine: NEGATIVE
Glucose, UA: NEGATIVE mg/dL
Hgb urine dipstick: NEGATIVE
Ketones, ur: NEGATIVE mg/dL
Leukocytes,Ua: NEGATIVE
Nitrite: NEGATIVE
Protein, ur: NEGATIVE mg/dL
Specific Gravity, Urine: 1.023 (ref 1.005–1.030)
pH: 5.5 (ref 5.0–8.0)

## 2023-07-15 LAB — COMPREHENSIVE METABOLIC PANEL WITH GFR
ALT: 36 U/L (ref 0–44)
AST: 23 U/L (ref 15–41)
Albumin: 4.2 g/dL (ref 3.5–5.0)
Alkaline Phosphatase: 100 U/L (ref 38–126)
Anion gap: 10 (ref 5–15)
BUN: 15 mg/dL (ref 6–20)
CO2: 26 mmol/L (ref 22–32)
Calcium: 9.8 mg/dL (ref 8.9–10.3)
Chloride: 105 mmol/L (ref 98–111)
Creatinine, Ser: 1.03 mg/dL (ref 0.61–1.24)
GFR, Estimated: >60 mL/min (ref >60)
Glucose, Bld: 87 mg/dL (ref 70–99)
Potassium: 4.2 mmol/L (ref 3.5–5.1)
Sodium: 141 mmol/L (ref 135–145)
Total Bilirubin: 0.3 mg/dL (ref 0.0–1.2)
Total Protein: 6.6 g/dL (ref 6.5–8.1)

## 2023-07-15 LAB — PROTIME-INR
INR: 1 (ref 0.8–1.2)
Prothrombin Time: 12.9 s (ref 11.4–15.2)

## 2023-07-15 MED ORDER — IOHEXOL 350 MG/ML SOLN
100.0000 mL | Freq: Once | INTRAVENOUS | Status: AC | PRN
  Administered 2023-07-15: 75 mL via INTRAVENOUS
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
## 2023-07-15 NOTE — ED Notes (Signed)
 Patient transported to CT

## 2023-07-15 NOTE — ED Triage Notes (Signed)
 Patient states he "blacked out" on Monday. States vision has returned to normal. Hx of TBI. Currently having full body twitches. States that is normal since TBI.
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
## 2023-07-15 NOTE — ED Notes (Signed)
 Pt sent over from [facility] for MRI. Pt A&O, in NAD at time triage. Ambulatory to and from triage.

## 2023-07-15 NOTE — Progress Notes (Signed)
   07/15/23 2049  Spiritual Encounters  Type of Visit Initial  Care provided to: Patient  Reason for visit Routine spiritual support  OnCall Visit Yes   Provided spiritual care, prayer and reflective hearing/listening.

## 2023-07-15 NOTE — ED Notes (Signed)
 Pt is unable to give urine sample at this time.

## 2023-07-15 NOTE — Discharge Instructions (Addendum)
 Thank for let us  evaluate you today.  Your CTA and MRI of your brain were negative for acute abnormalities.  No signs of stroke.  You may follow-up with [facility] neurology for further management.  Please make sure to stay compliant with your medications.

## 2023-09-03 ENCOUNTER — Emergency Department (HOSPITAL_COMMUNITY)
Admission: EM | Admit: 2023-09-03 | Discharge: 2023-09-03 | Disposition: A | Discharge status: Home / Self Care | Attending: Student in an Organized Health Care Education/Training Program | Admitting: Student in an Organized Health Care Education/Training Program

## 2023-09-03 ENCOUNTER — Emergency Department (HOSPITAL_COMMUNITY)

## 2023-09-03 ENCOUNTER — Encounter (HOSPITAL_COMMUNITY): Payer: Self-pay | Admitting: Emergency Medicine

## 2023-09-03 ENCOUNTER — Other Ambulatory Visit: Payer: Self-pay

## 2023-09-03 DIAGNOSIS — M549 Dorsalgia, unspecified: Secondary | ICD-10-CM | POA: Diagnosis not present

## 2023-09-03 DIAGNOSIS — R0789 Other chest pain: Secondary | ICD-10-CM | POA: Diagnosis not present

## 2023-09-03 DIAGNOSIS — R0602 Shortness of breath: Secondary | ICD-10-CM | POA: Diagnosis not present

## 2023-09-03 DIAGNOSIS — R079 Chest pain, unspecified: Secondary | ICD-10-CM | POA: Diagnosis not present

## 2023-09-03 DIAGNOSIS — Z79899 Other long term (current) drug therapy: Secondary | ICD-10-CM | POA: Diagnosis not present

## 2023-09-03 DIAGNOSIS — R001 Bradycardia, unspecified: Secondary | ICD-10-CM | POA: Diagnosis not present

## 2023-09-03 DIAGNOSIS — R569 Unspecified convulsions: Secondary | ICD-10-CM | POA: Diagnosis not present

## 2023-09-03 DIAGNOSIS — I2510 Atherosclerotic heart disease of native coronary artery without angina pectoris: Secondary | ICD-10-CM | POA: Insufficient documentation

## 2023-09-03 DIAGNOSIS — Z7982 Long term (current) use of aspirin: Secondary | ICD-10-CM | POA: Insufficient documentation

## 2023-09-03 DIAGNOSIS — R9431 Abnormal electrocardiogram [ECG] [EKG]: Secondary | ICD-10-CM | POA: Diagnosis not present

## 2023-09-03 LAB — COMPREHENSIVE METABOLIC PANEL WITH GFR
ALT: 29 U/L (ref 0–44)
AST: 22 U/L (ref 15–41)
Albumin: 3.8 g/dL (ref 3.5–5.0)
Alkaline Phosphatase: 87 U/L (ref 38–126)
Anion gap: 8 (ref 5–15)
BUN: 14 mg/dL (ref 6–20)
CO2: 19 mmol/L — ABNORMAL LOW (ref 22–32)
Calcium: 8.9 mg/dL (ref 8.9–10.3)
Chloride: 111 mmol/L (ref 98–111)
Creatinine, Ser: 0.93 mg/dL (ref 0.61–1.24)
GFR, Estimated: >60 mL/min (ref >60)
Glucose, Bld: 99 mg/dL (ref 70–99)
Potassium: 4.1 mmol/L (ref 3.5–5.1)
Sodium: 138 mmol/L (ref 135–145)
Total Bilirubin: 0.7 mg/dL (ref 0.0–1.2)
Total Protein: 6.3 g/dL — ABNORMAL LOW (ref 6.5–8.1)

## 2023-09-03 LAB — CBC WITH DIFFERENTIAL/PLATELET
Abs Immature Granulocytes: 0.01 K/uL (ref 0.00–0.07)
Basophils Absolute: 0 K/uL (ref 0.0–0.1)
Basophils Relative: 1 %
Eosinophils Absolute: 0.2 K/uL (ref 0.0–0.5)
Eosinophils Relative: 3 %
HCT: 44.6 % (ref 39.0–52.0)
Hemoglobin: 14.9 g/dL (ref 13.0–17.0)
Immature Granulocytes: 0 %
Lymphocytes Relative: 45 %
Lymphs Abs: 2.5 K/uL (ref 0.7–4.0)
MCH: 31.3 pg (ref 26.0–34.0)
MCHC: 33.4 g/dL (ref 30.0–36.0)
MCV: 93.7 fL (ref 80.0–100.0)
Monocytes Absolute: 0.4 K/uL (ref 0.1–1.0)
Monocytes Relative: 7 %
Neutro Abs: 2.4 K/uL (ref 1.7–7.7)
Neutrophils Relative %: 44 %
Platelets: 209 K/uL (ref 150–400)
RBC: 4.76 MIL/uL (ref 4.22–5.81)
RDW: 13.1 % (ref 11.5–15.5)
WBC: 5.5 K/uL (ref 4.0–10.5)
nRBC: 0 % (ref 0.0–0.2)

## 2023-09-03 LAB — URINALYSIS, ROUTINE W REFLEX MICROSCOPIC
Bilirubin Urine: NEGATIVE
Glucose, UA: NEGATIVE mg/dL
Hgb urine dipstick: NEGATIVE
Ketones, ur: NEGATIVE mg/dL
Leukocytes,Ua: NEGATIVE
Nitrite: NEGATIVE
Protein, ur: NEGATIVE mg/dL
Specific Gravity, Urine: 1.025 (ref 1.005–1.030)
pH: 5 (ref 5.0–8.0)

## 2023-09-03 LAB — TROPONIN I (HIGH SENSITIVITY)
Troponin I (High Sensitivity): 5 ng/L (ref <18)
Troponin I (High Sensitivity): 5 ng/L (ref <18)

## 2023-09-03 LAB — D-DIMER, QUANTITATIVE: D-Dimer, Quant: <0.27 ug{FEU}/mL (ref 0.00–0.50)

## 2023-09-03 MED ORDER — ALUM & MAG HYDROXIDE-SIMETH 200-200-20 MG/5ML PO SUSP
30.0000 mL | Freq: Once | ORAL | Status: AC
  Administered 2023-09-03: 30 mL via ORAL
  Filled 2023-09-03: qty 30

## 2023-09-03 MED ORDER — MORPHINE SULFATE (PF) 4 MG/ML IV SOLN
4.0000 mg | Freq: Once | INTRAVENOUS | Status: AC
  Administered 2023-09-03: 4 mg via INTRAVENOUS
  Filled 2023-09-03: qty 1

## 2023-09-03 MED ORDER — ONDANSETRON HCL 4 MG/2ML IJ SOLN
4.0000 mg | Freq: Once | INTRAMUSCULAR | Status: AC
  Administered 2023-09-03: 4 mg via INTRAVENOUS
  Filled 2023-09-03: qty 2

## 2023-09-03 MED ORDER — KETOROLAC TROMETHAMINE 15 MG/ML IJ SOLN
15.0000 mg | Freq: Once | INTRAMUSCULAR | Status: DC
Start: 2023-09-03 — End: 2023-09-03

## 2023-09-03 MED ORDER — LIDOCAINE VISCOUS HCL 2 % MT SOLN
15.0000 mL | Freq: Once | OROMUCOSAL | Status: AC
  Administered 2023-09-03: 15 mL via ORAL
  Filled 2023-09-03: qty 15

## 2023-09-03 MED ORDER — KETOROLAC TROMETHAMINE 15 MG/ML IJ SOLN
15.0000 mg | Freq: Once | INTRAMUSCULAR | Status: AC
Start: 2023-09-03 — End: 2023-09-03
  Administered 2023-09-03: 15 mg via INTRAVENOUS
  Filled 2023-09-03: qty 1

## 2023-09-03 NOTE — Discharge Instructions (Addendum)
 Today you were seen for chest pain.  You have been given an ambulatory referral to cardiology for further evaluation and workup.  Thank you for letting us  treat you today. After reviewing your labs and imaging, I feel you are safe to go home. Please follow up with your PCP in the next several days and provide them with your records from this visit. Return to the Emergency Room if pain becomes severe or symptoms worsen.

## 2023-09-03 NOTE — ED Triage Notes (Signed)
 Pt BIB [facility] from Urgent Care due to chest pain.  Pt reports it radiates towards back. Pt reports it started a couple days ago.  20g left wrist.  Hx PE. Pt given 50mcg Fentanyl  en route. VS BP 140/88, HR 78, SpO2 100%RA

## 2023-09-03 NOTE — ED Provider Notes (Signed)
 [place] EMERGENCY DEPARTMENT AT [facility] Provider Note   CSN: [id number] Arrival date & time: 09/03/23  1408     Patient presents with: Chest Pain   Antonio Smith is a 48 y.o. male past medical history of again for shortness of breath, PE, CAD, and GERD presents today for chest pain x 3 days.  Patient reports it starts in the center of his chest and radiates towards his back.  Patient also reports shortness of breath.  Patient denies fall, nausea, vomiting, abdominal pain, history of abdominal surgeries, diarrhea, numbness, weakness, or current blood thinner use.    Chest Pain Associated symptoms: shortness of breath        Prior to Admission medications   Medication Sig Start Date End Date Taking? Authorizing Provider  albuterol  (VENTOLIN  HFA) 108 (90 Base) MCG/ACT inhaler Inhale 1 puff into the lungs every 4 (four) hours. 10/23/21   [provider]  aspirin  EC 81 MG tablet Take 1 tablet (81 mg total) by mouth daily. Swallow whole. 02/27/22   Alvan Ronal BRAVO, MD  atorvastatin  (LIPITOR) 20 MG tablet Take 1 tablet (20 mg total) by mouth daily. 06/15/23   Alvan Ronal BRAVO, MD  cloBAZam  (ONFI ) 10 MG tablet Take 1 tablet (10 mg total) by mouth 2 (two) times daily. 04/22/23 10/19/23  Camara, Amadou, MD  clonazePAM  (KLONOPIN ) 0.5 MG tablet Take 1 tablet (0.5 mg total) by mouth daily as needed for anxiety. 01/29/23   Mannie Pac T, DO  fluticasone -salmeterol (ADVAIR) 500-50 MCG/ACT AEPB Inhale 1 puff into the lungs in the morning and at bedtime. 12/25/21   Hunsucker, Donnice SAUNDERS, MD  metoprolol  succinate (TOPROL -XL) 25 MG 24 hr tablet Take 0.5 tablets (12.5 mg total) by mouth daily. WITH OR IMMEDIATELY FOLLOWING A MEAL 06/15/23   Alvan Ronal BRAVO, MD  nitroGLYCERIN  (NITROSTAT ) 0.4 MG SL tablet Place 1 tablet (0.4 mg total) under the tongue every 5 (five) minutes as needed for chest pain. 08/01/22   Alvan Ronal BRAVO, MD  olmesartan (BENICAR) 20 MG tablet Take 20 mg by mouth daily.  07/15/23   [provider]  varenicline (CHANTIX) 1 MG tablet 1/2 pill once day for 3 days, then 1/2 pill twice a day for 4 days, then 1 pill twice a day thereafter Orally Once a day for 30 days 08/27/22   [provider]    Allergies: Prednisone    Review of Systems  Respiratory:  Positive for shortness of breath.   Cardiovascular:  Positive for chest pain.    Updated Vital Signs BP (!) 138/93 (BP Location: Right Arm)   Pulse (!) 59   Temp 98 F (36.7 C) (Oral)   Resp 13   Ht 5' 11 (1.803 m)   Wt 114.3 kg   SpO2 100%   BMI 35.15 kg/m   Physical Exam Vitals and nursing note reviewed.  Constitutional:      General: He is not in acute distress.    Appearance: He is well-developed.  HENT:     Head: Normocephalic and atraumatic.  Eyes:     Conjunctiva/sclera: Conjunctivae normal.  Cardiovascular:     Rate and Rhythm: Normal rate and regular rhythm.     Heart sounds: Normal heart sounds. No murmur heard. Pulmonary:     Effort: Pulmonary effort is normal. No respiratory distress.     Breath sounds: Normal breath sounds.  Abdominal:     Palpations: Abdomen is soft.     Tenderness: There is no abdominal  tenderness.  Musculoskeletal:        General: No swelling.     Cervical back: Neck supple.     Right lower leg: No edema.     Left lower leg: No edema.  Skin:    General: Skin is warm and dry.     Capillary Refill: Capillary refill takes less than 2 seconds.  Neurological:     Mental Status: He is alert.  Psychiatric:        Mood and Affect: Mood normal.     (all labs ordered are listed, but only abnormal results are displayed) Labs Reviewed  COMPREHENSIVE METABOLIC PANEL WITH GFR - Abnormal; Notable for the following components:      Result Value   CO2 19 (*)    Total Protein 6.3 (*)    All other components within normal limits  URINALYSIS, ROUTINE W REFLEX MICROSCOPIC - Abnormal; Notable for the following components:   APPearance HAZY (*)     All other components within normal limits  D-DIMER, QUANTITATIVE  CBC WITH DIFFERENTIAL/PLATELET  TROPONIN I (HIGH SENSITIVITY)  TROPONIN I (HIGH SENSITIVITY)    EKG: None  Radiology: DG Chest Portable 1 View Result Date: 09/03/2023 CLINICAL DATA:  Short of breath EXAM: PORTABLE CHEST 1 VIEW COMPARISON:  None Available. FINDINGS: Normal mediastinum and cardiac silhouette. Normal pulmonary vasculature. No evidence of effusion, infiltrate, or pneumothorax. No acute bony abnormality. IMPRESSION: No acute cardiopulmonary process. Electronically Signed   By: Jackquline Boxer M.D.   On: 09/03/2023 15:13     Procedures   Medications Ordered in the ED  morphine  (PF) 4 MG/ML injection 4 mg (4 mg Intravenous Given 09/03/23 1456)  ondansetron  (ZOFRAN ) injection 4 mg (4 mg Intravenous Given 09/03/23 1455)  morphine  (PF) 4 MG/ML injection 4 mg (4 mg Intravenous Given 09/03/23 1708)  alum & mag hydroxide-simeth (MAALOX/MYLANTA) 200-200-20 MG/5ML suspension 30 mL (30 mLs Oral Given 09/03/23 1705)    And  lidocaine  (XYLOCAINE ) 2 % viscous mouth solution 15 mL (15 mLs Oral Given 09/03/23 1705)  ketorolac  (TORADOL ) 15 MG/ML injection 15 mg (15 mg Intravenous Given 09/03/23 1946)                                    Medical Decision Making Amount and/or Complexity of Data Reviewed Labs: ordered. Radiology: ordered.  Risk Prescription drug management.   This patient presents to the ED for concern of chest pain, this involves an extensive number of treatment options, and is a complaint that carries with it a high risk of complications and morbidity.  The differential diagnosis includes GERD, STEMI, NSTEMI, arrhythmia, electrolyte abnormality, PE, stable angina, unstable angina   Co morbidities / Chronic conditions that complicate the patient evaluation  PE, CAD, shortness of breath   Additional history obtained:  Additional history obtained from EMR External records from outside source obtained  and reviewed including Care Everywhere   Lab Tests:  I Ordered, and personally interpreted labs.  The pertinent results include: CBC WNL, D-dimer less than 0.27, CMP with mildly reduced CO2 and total protein, troponin 5, 5, UA WNL   Imaging Studies ordered:  I ordered imaging studies including chest x-ray I independently visualized and interpreted imaging which showed no acute cardiopulmonary process I agree with the radiologist interpretation   Cardiac Monitoring: / EKG:  The patient was maintained on a cardiac monitor.  I personally viewed and interpreted the cardiac monitored which  showed an underlying rhythm of: Sinus rhythm   Problem List / ED Course / Critical interventions / Medication management  I ordered medication including morphine  and Zofran , GI cocktail, Toradol  Reevaluation of the patient after these medicines showed that the patient some improvement I have reviewed the patients home medicines and have made adjustments as needed   Test / Admission - Considered:  Considered for imaging or further workup however patient's vital signs, physical exam, labs, and imaging have been reassuring.  Patient given ambulatory referral to cardiology for further evaluation workup.  Patient given return precautions.  I feel patient is safe for discharge at this time.     Final diagnoses:  Atypical chest pain    ED Discharge Orders          Ordered    Ambulatory referral to Cardiology       Comments: If you have not heard from the Cardiology office within the next 72 hours please call [phone].   09/03/23 1948               Francis Ileana SAILOR, PA-C 09/03/23 1949    Lowther, Amy, DO 09/06/23 [phone]

## 2023-10-21 ENCOUNTER — Encounter: Payer: Self-pay | Admitting: Neurology

## 2023-10-21 ENCOUNTER — Ambulatory Visit: Payer: Federal, State, Local not specified - PPO | Admitting: Neurology

## 2023-11-04 ENCOUNTER — Other Ambulatory Visit: Payer: Self-pay

## 2023-11-04 ENCOUNTER — Emergency Department (HOSPITAL_BASED_OUTPATIENT_CLINIC_OR_DEPARTMENT_OTHER)
Admission: EM | Admit: 2023-11-04 | Discharge: 2023-11-04 | Disposition: A | Discharge status: Home / Self Care | Attending: Emergency Medicine | Admitting: Emergency Medicine

## 2023-11-04 ENCOUNTER — Encounter (HOSPITAL_BASED_OUTPATIENT_CLINIC_OR_DEPARTMENT_OTHER): Payer: Self-pay | Admitting: Emergency Medicine

## 2023-11-04 ENCOUNTER — Emergency Department (HOSPITAL_BASED_OUTPATIENT_CLINIC_OR_DEPARTMENT_OTHER)

## 2023-11-04 DIAGNOSIS — M25512 Pain in left shoulder: Secondary | ICD-10-CM | POA: Diagnosis not present

## 2023-11-04 DIAGNOSIS — I2510 Atherosclerotic heart disease of native coronary artery without angina pectoris: Secondary | ICD-10-CM | POA: Insufficient documentation

## 2023-11-04 DIAGNOSIS — Z7982 Long term (current) use of aspirin: Secondary | ICD-10-CM | POA: Insufficient documentation

## 2023-11-04 DIAGNOSIS — M19012 Primary osteoarthritis, left shoulder: Secondary | ICD-10-CM | POA: Diagnosis not present

## 2023-11-04 DIAGNOSIS — R079 Chest pain, unspecified: Secondary | ICD-10-CM | POA: Insufficient documentation

## 2023-11-04 DIAGNOSIS — R0789 Other chest pain: Secondary | ICD-10-CM | POA: Diagnosis not present

## 2023-11-04 LAB — CBC
HCT: 44.7 % (ref 39.0–52.0)
Hemoglobin: 15.2 g/dL (ref 13.0–17.0)
MCH: 31.2 pg (ref 26.0–34.0)
MCHC: 34 g/dL (ref 30.0–36.0)
MCV: 91.8 fL (ref 80.0–100.0)
Platelets: 220 K/uL (ref 150–400)
RBC: 4.87 MIL/uL (ref 4.22–5.81)
RDW: 13 % (ref 11.5–15.5)
WBC: 8.4 K/uL (ref 4.0–10.5)
nRBC: 0 % (ref 0.0–0.2)

## 2023-11-04 LAB — D-DIMER, QUANTITATIVE: D-Dimer, Quant: <0.27 ug{FEU}/mL (ref 0.00–0.50)

## 2023-11-04 LAB — BASIC METABOLIC PANEL WITH GFR
Anion gap: 13 (ref 5–15)
BUN: 15 mg/dL (ref 6–20)
CO2: 22 mmol/L (ref 22–32)
Calcium: 9.6 mg/dL (ref 8.9–10.3)
Chloride: 105 mmol/L (ref 98–111)
Creatinine, Ser: 1.04 mg/dL (ref 0.61–1.24)
GFR, Estimated: >60 mL/min (ref >60)
Glucose, Bld: 107 mg/dL — ABNORMAL HIGH (ref 70–99)
Potassium: 4 mmol/L (ref 3.5–5.1)
Sodium: 140 mmol/L (ref 135–145)

## 2023-11-04 LAB — TROPONIN T, HIGH SENSITIVITY
Troponin T High Sensitivity: <15 ng/L (ref 0–19)
Troponin T High Sensitivity: <15 ng/L (ref 0–19)

## 2023-11-04 MED ORDER — ONDANSETRON HCL 4 MG/2ML IJ SOLN
4.0000 mg | Freq: Once | INTRAMUSCULAR | Status: AC
  Administered 2023-11-04: 4 mg via INTRAVENOUS
  Filled 2023-11-04: qty 2

## 2023-11-04 MED ORDER — OXYCODONE HCL 5 MG PO TABS
5.0000 mg | ORAL_TABLET | Freq: Once | ORAL | Status: AC
Start: 2023-11-04 — End: 2023-11-04
  Administered 2023-11-04: 5 mg via ORAL
  Filled 2023-11-04: qty 1

## 2023-11-04 MED ORDER — METHOCARBAMOL 500 MG PO TABS: 1000.0000 mg | ORAL_TABLET | Freq: Three times a day (TID) | ORAL | 0 refills | Status: DC | PRN

## 2023-11-04 MED ORDER — MORPHINE SULFATE (PF) 4 MG/ML IV SOLN
4.0000 mg | Freq: Once | INTRAVENOUS | Status: AC
  Administered 2023-11-04: 4 mg via INTRAVENOUS
  Filled 2023-11-04: qty 1

## 2023-11-04 MED ORDER — OXYCODONE HCL 5 MG PO TABS: 5.0000 mg | ORAL_TABLET | Freq: Four times a day (QID) | ORAL | 0 refills | Status: DC | PRN

## 2023-11-04 MED ORDER — OXYCODONE HCL 5 MG PO TABS: 5.0000 mg | ORAL_TABLET | Freq: Four times a day (QID) | ORAL | 0 refills | Status: AC | PRN

## 2023-11-04 MED ORDER — METHOCARBAMOL 500 MG PO TABS
1000.0000 mg | ORAL_TABLET | Freq: Once | ORAL | Status: AC
  Administered 2023-11-04: 1000 mg via ORAL
  Filled 2023-11-04: qty 2

## 2023-11-04 MED ORDER — METHOCARBAMOL 500 MG PO TABS: 1000.0000 mg | ORAL_TABLET | Freq: Three times a day (TID) | ORAL | 0 refills | Status: AC | PRN

## 2023-11-04 NOTE — ED Provider Notes (Signed)
 [place] EMERGENCY DEPARTMENT AT [facility] Provider Note   CSN: [id number] Arrival date & time: 11/04/23  1636     Patient presents with: Chest Pain and Shoulder Pain   Antonio Smith is a 48 y.o. male.   Patient with past medical history of shortness of breath, PE, CAD, and GERD, ED visit for CP 08/2023 with reassuring work-up -- presents to the ED today for evaluation of left-sided chest and shoulder pain starting yesterday.  Patient denies injuries.  Pain starts in the left chest and radiates to the left shoulder.  He states that the pain is worse with movement and he is unable to pick his arm up due to pain.  He states that he took oxycodone  without improvement.  No distal numbness or tingling.       Prior to Admission medications   Medication Sig Start Date End Date Taking? Authorizing Provider  albuterol  (VENTOLIN  HFA) 108 (90 Base) MCG/ACT inhaler Inhale 1 puff into the lungs every 4 (four) hours. 10/23/21   [provider]  aspirin  EC 81 MG tablet Take 1 tablet (81 mg total) by mouth daily. Swallow whole. 02/27/22   Alvan Ronal BRAVO, MD  atorvastatin  (LIPITOR) 20 MG tablet Take 1 tablet (20 mg total) by mouth daily. 06/15/23   Alvan Ronal BRAVO, MD  cloBAZam  (ONFI ) 10 MG tablet Take 1 tablet (10 mg total) by mouth 2 (two) times daily. 04/22/23 10/19/23  Camara, Amadou, MD  clonazePAM  (KLONOPIN ) 0.5 MG tablet Take 1 tablet (0.5 mg total) by mouth daily as needed for anxiety. 01/29/23   Mannie Pac T, DO  fluticasone -salmeterol (ADVAIR) 500-50 MCG/ACT AEPB Inhale 1 puff into the lungs in the morning and at bedtime. 12/25/21   Hunsucker, Donnice SAUNDERS, MD  metoprolol  succinate (TOPROL -XL) 25 MG 24 hr tablet Take 0.5 tablets (12.5 mg total) by mouth daily. WITH OR IMMEDIATELY FOLLOWING A MEAL 06/15/23   Alvan Ronal BRAVO, MD  nitroGLYCERIN  (NITROSTAT ) 0.4 MG SL tablet Place 1 tablet (0.4 mg total) under the tongue every 5 (five) minutes as needed for chest pain. 08/01/22    Alvan Ronal BRAVO, MD  olmesartan (BENICAR) 20 MG tablet Take 20 mg by mouth daily. 07/15/23   [provider]  varenicline (CHANTIX) 1 MG tablet 1/2 pill once day for 3 days, then 1/2 pill twice a day for 4 days, then 1 pill twice a day thereafter Orally Once a day for 30 days 08/27/22   [provider]    Allergies: Prednisone    Review of Systems  Updated Vital Signs BP (!) 128/99   Pulse 84   Temp 98.6 F (37 C) (Oral)   Resp 20   Wt 115.7 kg   SpO2 97%   BMI 35.57 kg/m   Physical Exam Vitals and nursing note reviewed.  Constitutional:      General: He is in acute distress (appears uncomfortable).     Appearance: He is well-developed. He is not diaphoretic.  HENT:     Head: Normocephalic and atraumatic.     Mouth/Throat:     Mouth: Mucous membranes are not dry.  Eyes:     Conjunctiva/sclera: Conjunctivae normal.  Neck:     Vascular: Normal carotid pulses. No carotid bruit or JVD.     Trachea: Trachea normal. No tracheal deviation.  Cardiovascular:     Rate and Rhythm: Normal rate and regular rhythm.     Pulses: No decreased pulses.  Radial pulses are 2+ on the right side and 2+ on the left side.     Heart sounds: Normal heart sounds, S1 normal and S2 normal. Heart sounds not distant. No murmur heard.    Comments: 2+ radial pulses bilateral.  Pulmonary:     Effort: Pulmonary effort is normal. No respiratory distress.     Breath sounds: Normal breath sounds. No wheezing.  Chest:     Chest wall: No swelling or tenderness.    Abdominal:     General: Bowel sounds are normal.     Palpations: Abdomen is soft.     Tenderness: There is no abdominal tenderness. There is no guarding or rebound.  Musculoskeletal:     Left shoulder: Tenderness present. Decreased range of motion.     Left upper arm: Tenderness present. No swelling.     Left elbow: Normal range of motion. No tenderness.     Left forearm: No tenderness or bony tenderness.     Left  wrist: No tenderness. Normal range of motion.     Cervical back: Normal range of motion and neck supple. No tenderness. No muscular tenderness. Normal range of motion.     Thoracic back: No tenderness. Normal range of motion.     Right lower leg: No edema.     Left lower leg: No edema.     Comments: I am able to passively range the shoulder and patient is able to resist somewhat but with discomfort.   Skin:    General: Skin is warm and dry.     Coloration: Skin is not pale.  Neurological:     Mental Status: He is alert. Mental status is at baseline.  Psychiatric:        Mood and Affect: Mood normal.     (all labs ordered are listed, but only abnormal results are displayed) Labs Reviewed  BASIC METABOLIC PANEL WITH GFR - Abnormal; Notable for the following components:      Result Value   Glucose, Bld 107 (*)    All other components within normal limits  CBC  D-DIMER, QUANTITATIVE  TROPONIN T, HIGH SENSITIVITY  TROPONIN T, HIGH SENSITIVITY    EKG: EKG Interpretation Date/Time:  Wednesday November 04 2023 16:44:39 EDT Ventricular Rate:  83 PR Interval:  162 QRS Duration:  100 QT Interval:  399 QTC Calculation: 469 R Axis:   42  Text Interpretation: Sinus rhythm Abnormal inferior Q waves No significant change since last tracing Confirmed by Randol Simmonds [phone]) on 11/04/2023 4:49:54 PM  Radiology: [facility] Shoulder Left Result Date: 11/04/2023 CLINICAL DATA:  Left shoulder pain EXAM: LEFT SHOULDER - 2+ VIEW COMPARISON:  None Available. FINDINGS: Internal rotation, external rotation, and transscapular views of the left shoulder are obtained on 5 images. No fracture, subluxation, or dislocation. Mild acromioclavicular and glenohumeral joint osteoarthritis. Soft tissues are unremarkable. Left chest is clear. IMPRESSION: 1. Mild osteoarthritis.  No acute fracture. Electronically Signed   By: Ozell Daring M.D.   On: 11/04/2023 17:26   DG Chest Portable 1 View Result Date:  11/04/2023 CLINICAL DATA:  Chest pain, left shoulder pain EXAM: PORTABLE CHEST 1 VIEW COMPARISON:  09/03/2023 FINDINGS: Heart and mediastinal contours are within normal limits. No focal opacities or effusions. No acute bony abnormality. No pneumothorax. IMPRESSION: No active disease. Electronically Signed   By: Franky Crease M.D.   On: 11/04/2023 17:13     Procedures   Medications Ordered in the ED  methocarbamol  (ROBAXIN ) tablet 1,000 mg (has no  administration in time range)  oxyCODONE  (Oxy IR/ROXICODONE ) immediate release tablet 5 mg (has no administration in time range)  morphine  (PF) 4 MG/ML injection 4 mg (4 mg Intravenous Given 11/04/23 1722)  ondansetron  (ZOFRAN ) injection 4 mg (4 mg Intravenous Given 11/04/23 1722)    ED Course  Patient seen and examined. History obtained directly from patient.   Labs/EKG: Ordered CBC, BMP, troponin.  Added D-dimer.  EKG personally reviewed and interpreted as above.  Imaging: Ordered chest x-ray, added left shoulder x-ray.  Medications/Fluids: Ordered: Morphine , Zofran .   Most recent vital signs reviewed and are as follows: BP (!) 128/99   Pulse 84   Temp 98.6 F (37 C) (Oral)   Resp 20   Wt 115.7 kg   SpO2 97%   BMI 35.57 kg/m   Initial impression: Left-sided chest pain, shoulder pain.  Left upper extremity appears to be neurovascularly intact.  No signs of swelling.  Normal distal pulses.  Patient is tender to palpation, pain worse with movement and he is guarding the area.  6:20 PM Reassessment performed. Patient appears more comfortable now but still has significant pain with ranging of his left upper extremity.  Upper extremity exam is stable.  I am able to pick his arm up off the bed and he is able to hold it up against gravity somewhat but is uncomfortable.  Labs personally reviewed and interpreted including: CBC unremarkable with normal white blood cell count and hemoglobin; BMP glucose 107 otherwise unremarkable; D-dimer is  negative at less than 0.27; ponies less than 15.  Imaging personally visualized and interpreted including: X-ray of the chest, agree no acute findings.  X-ray of the shoulder, agree no fracture or dislocation, radiology read notes mild osteoarthritis.  Reviewed pertinent lab work and imaging with patient at bedside. Questions answered.   Most current vital signs reviewed and are as follows: BP 137/89   Pulse 80   Temp 98.6 F (37 C) (Oral)   Resp (!) 24   Wt 115.7 kg   SpO2 97%   BMI 35.57 kg/m   Plan: Patient was discussed with and seen by Dr. Randol due to pain out of proportion.  Currently awaiting second troponin.  Agrees with current plan. Agrees doubt septic joint, vascular emergency, soft tissue infection.   6:43 PM I have ordered p.o. medications Robaxin  and oxycodone  for the patient.  Signed out to [employer] at shift change.  Currently awaiting second troponin.  If negative, advised orthopedic follow-up, referral will be given.  I have sent in #8 oxycodone  5 mg tablets and Robaxin  for the patient.                                    Medical Decision Making  For this patient's complaint of chest pain, the following emergent conditions were considered on the differential diagnosis: acute coronary syndrome, pulmonary embolism, pneumothorax, myocarditis, pericardial tamponade, aortic dissection, thoracic aortic aneurysm complication, esophageal perforation.   Other causes were also considered including: gastroesophageal reflux disease, musculoskeletal pain including costochondritis, pneumonia/pleurisy, herpes zoster, pericarditis.  Patient has known coronary artery disease, treated medically.  Also history of PE, that per history sounds provoked.   In regards to possibility of ACS, patient has atypical features of pain, reproducible features of pain, non-ischemic and unchanged EKG and negative troponin.  Awaiting second troponin.  Reproducibility of symptoms atypical for  ACS.  In regards to possibility of PE,  symptoms are atypical for PE, D-dimer less than 0.27.  Patient is not tachycardic, particular short of breath, or hypoxic.  Chest x-ray is clear.  Low concern for PE.  Considered possibility of septic arthritis in the left shoulder joint causing pain.  Patient does not have immunocompromising risk factors, overlying erythema or redness, elevated white blood cell count, or fever.  Overall low suspicion for septic arthritis given presentation today.  Patient is tender to palpation in the shoulder, however no crepitus, soft tissue swelling, abnormal findings on x-ray.  Low concern for necrotizing fasciitis.  No fever or tachycardia.  Left upper extremity neurovascularly intact with normal distal pulse.  Exam not consistent with arterial dissection or DVT.  If remainder of work-up is reassuring, will focus on pain control, provide with sling and orthopedic follow-up.      Final diagnoses:  Acute pain of left shoulder  Left-sided chest pain    ED Discharge Orders          Ordered    oxyCODONE  (OXY IR/ROXICODONE ) 5 MG immediate release tablet  Every 6 hours PRN        11/04/23 1840    methocarbamol  (ROBAXIN ) 500 MG tablet  Every 8 hours PRN        11/04/23 1840               Desiderio Chew, PA-C 11/04/23 1849    Randol Simmonds, MD 11/04/23 2034

## 2023-11-04 NOTE — Discharge Instructions (Addendum)
 It was a pleasure taking care of you today.  As discussed, your workup was reassuring.  Your cardiac markers are normal.  Your lab for a blood clot was negative.  I am sending you home with pain medication and a muscle relaxer.  Take as needed for pain.  Medication can cause drowsiness so do not drive or operate machinery while on the medication.  Please call the orthopedic surgeon for further evaluation.  Return to the ER for any worsening symptoms.

## 2023-11-04 NOTE — ED Triage Notes (Addendum)
 Left shoulder pain and SOB starting yesterday. Painful deep breathing. HX PE several years ago. No longer on thinners. Thinks his shoulder may have popped out-5-6 times in the past. Oxy daily for back pain-last dose this AM.

## 2023-11-04 NOTE — ED Provider Notes (Signed)
 Care assumed from [facility], PA-C at shift change pending second troponin and pain management.  See his note for full HPI.  In short, patient is a 48 year old male who presents to the ED due to left-sided chest pain that radiates to left shoulder that started yesterday.  Pain is worse with movement of arm.  No fever or chills.  Plan from previous provider.  If second troponin is unremarkable patient may be discharged home with symptomatic treatment and orthopedics follow-up. Physical Exam  BP 137/89   Pulse 80   Temp 98.6 F (37 C) (Oral)   Resp (!) 24   Wt 115.7 kg   SpO2 97%   BMI 35.57 kg/m   Physical Exam Vitals and nursing note reviewed.  Constitutional:      General: He is not in acute distress.    Appearance: He is not ill-appearing.  HENT:     Head: Normocephalic.  Eyes:     Pupils: Pupils are equal, round, and reactive to light.  Cardiovascular:     Rate and Rhythm: Normal rate and regular rhythm.     Pulses: Normal pulses.     Heart sounds: Normal heart sounds. No murmur heard.    No friction rub. No gallop.  Pulmonary:     Effort: Pulmonary effort is normal.     Breath sounds: Normal breath sounds.  Abdominal:     General: Abdomen is flat. There is no distension.     Palpations: Abdomen is soft.     Tenderness: There is no abdominal tenderness. There is no guarding or rebound.  Musculoskeletal:        General: Normal range of motion.     Cervical back: Neck supple.     Comments: Tenderness to left shoulder. I am able to fully range left shoulder. Patient has difficulty lifting arm due to pain.   Skin:    General: Skin is warm and dry.  Neurological:     General: No focal deficit present.     Mental Status: He is alert.  Psychiatric:        Mood and Affect: Mood normal.        Behavior: Behavior normal.     Procedures  Procedures  ED Course / MDM    Medical Decision Making Amount and/or Complexity of Data Reviewed Independent Historian:  friend Labs: ordered. Decision-making details documented in ED Course. Radiology: ordered and independent interpretation performed. Decision-making details documented in ED Course. ECG/medicine tests: ordered and independent interpretation performed. Decision-making details documented in ED Course.  Risk Prescription drug management.   Care assumed from [facility], PA-C at shift change pending second troponin.  See his note for full MDM.  48 year old male presents to the ED due to left-sided chest pain that radiates to left shoulder that started yesterday.  I reviewed workup from previous provider.  D-dimer normal.  Low suspicion for PE.  CBC unremarkable.  No leukocytosis.  Normal hemoglobin.  BMP reassuring.  Normal renal function.  No major electrolyte derangements.  Troponin x 2 normal.  EKG normal sinus rhythm.  No signs of acute ischemia.  Low suspicion for ACS.  Shoulder x-ray personally reviewed and interpreted which demonstrates mild osteoarthritic arthritis.  No joint effusion.  No other abnormalities.  Patient does have tenderness on exam.  I am able to range shoulder on exam. Discussed with Dr. Randol who evaluated patient at bedside and has lower suspicion for septic joint.  Chest x-ray negative.  Lower suspicion for central  cord compression.  Unclear etiology of symptoms however, low suspicion for emergent cardiac etiology.  Patient discharged with Robaxin  and oxycodone  for pain management.  Orthopedics number given at discharge and advised to call to schedule an appointment for further evaluation.  Patient stable for discharge. Strict ED precautions discussed with patient. Patient states understanding and agrees to plan. Patient discharged home in no acute distress and stable vitals       Lorelle Aleck JAYSON DEVONNA 11/04/23 2049    Randol Simmonds, MD 11/06/23 [phone]

## 2023-11-05 DIAGNOSIS — M25512 Pain in left shoulder: Secondary | ICD-10-CM | POA: Diagnosis not present

## 2023-11-08 DIAGNOSIS — M25512 Pain in left shoulder: Secondary | ICD-10-CM | POA: Diagnosis not present

## 2023-11-11 DIAGNOSIS — S46812D Strain of other muscles, fascia and tendons at shoulder and upper arm level, left arm, subsequent encounter: Secondary | ICD-10-CM | POA: Diagnosis not present

## 2023-12-04 DIAGNOSIS — M75112 Incomplete rotator cuff tear or rupture of left shoulder, not specified as traumatic: Secondary | ICD-10-CM | POA: Diagnosis not present
